# Patient Record
Sex: Female | Born: 1980 | Race: White | Hispanic: No | Marital: Single | Smoking: Former smoker
Health system: Southern US, Community
[De-identification: ages and names within clinical notes are randomized; demographics above are authoritative.]

## PROBLEM LIST (undated history)

## (undated) DIAGNOSIS — F988 Other specified behavioral and emotional disorders with onset usually occurring in childhood and adolescence: Secondary | ICD-10-CM

## (undated) DIAGNOSIS — F419 Anxiety disorder, unspecified: Secondary | ICD-10-CM

## (undated) DIAGNOSIS — G43909 Migraine, unspecified, not intractable, without status migrainosus: Secondary | ICD-10-CM

## (undated) DIAGNOSIS — D649 Anemia, unspecified: Secondary | ICD-10-CM

## (undated) DIAGNOSIS — G4700 Insomnia, unspecified: Secondary | ICD-10-CM

## (undated) DIAGNOSIS — F32A Depression, unspecified: Secondary | ICD-10-CM

## (undated) DIAGNOSIS — Z720 Tobacco use: Secondary | ICD-10-CM

## (undated) DIAGNOSIS — F329 Major depressive disorder, single episode, unspecified: Secondary | ICD-10-CM

## (undated) HISTORY — DX: Insomnia, unspecified: G47.00

## (undated) HISTORY — DX: Other specified behavioral and emotional disorders with onset usually occurring in childhood and adolescence: F98.8

## (undated) HISTORY — PX: WISDOM TOOTH EXTRACTION: SHX21

## (undated) HISTORY — DX: Depression, unspecified: F32.A

## (undated) HISTORY — DX: Migraine, unspecified, not intractable, without status migrainosus: G43.909

## (undated) HISTORY — DX: Anemia, unspecified: D64.9

## (undated) HISTORY — DX: Tobacco use: Z72.0

## (undated) HISTORY — DX: Major depressive disorder, single episode, unspecified: F32.9

## (undated) HISTORY — DX: Anxiety disorder, unspecified: F41.9

---

## 2011-05-15 HISTORY — PX: FRACTURE SURGERY: SHX138

## 2012-08-24 ENCOUNTER — Emergency Department: Payer: Self-pay | Admitting: Emergency Medicine

## 2012-08-25 LAB — CBC
HCT: 36.3 % (ref 35.0–47.0)
MCH: 31.4 pg (ref 26.0–34.0)
MCV: 91 fL (ref 80–100)
Platelet: 253 10*3/uL (ref 150–440)
RBC: 4 10*6/uL (ref 3.80–5.20)
RDW: 12.1 % (ref 11.5–14.5)
WBC: 9.9 10*3/uL (ref 3.6–11.0)

## 2012-08-25 LAB — ETHANOL
Ethanol %: 0.204 % — ABNORMAL HIGH (ref 0.000–0.080)
Ethanol: 204 mg/dL

## 2012-08-25 LAB — BASIC METABOLIC PANEL
Calcium, Total: 7.4 mg/dL — ABNORMAL LOW (ref 8.5–10.1)
Chloride: 119 mmol/L — ABNORMAL HIGH (ref 98–107)
Creatinine: 0.71 mg/dL (ref 0.60–1.30)
EGFR (African American): >60
EGFR (Non-African Amer.): >60
Glucose: 104 mg/dL — ABNORMAL HIGH (ref 65–99)
Potassium: 4.3 mmol/L (ref 3.5–5.1)

## 2013-06-26 ENCOUNTER — Inpatient Hospital Stay: Payer: Self-pay

## 2013-06-27 LAB — DRUG SCREEN, URINE

## 2013-06-28 LAB — HEMATOCRIT: HCT: 18 % — AB (ref 35.0–47.0)

## 2013-06-29 DIAGNOSIS — Z9289 Personal history of other medical treatment: Secondary | ICD-10-CM | POA: Insufficient documentation

## 2013-06-29 LAB — HEMATOCRIT: HCT: 22.2 % — AB (ref 35.0–47.0)

## 2013-08-13 LAB — HM PAP SMEAR: HM PAP: NEGATIVE
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
## 2014-09-21 NOTE — H&P (Signed)
L&D Evaluation:  History:  HPI 34 yo G1 at 9556w4d gestation presenting with contractions.  Seen last 06/23/13 and noted to be 3/80/0.  +FM, no VB.  SROM at 1800 today clear.   Presents with contractions   Patient's Medical History Bipolar, anxiety, anemia   Patient's Surgical History none   Medications ambien 5mg  qhs prn, lamictal 150mg , protonix 40mg , zofran 8mg , adderall 10mg , clonazepam 0.5mg     Allergies NKDA   Social History none   Family History Non-Contributory   ROS:  ROS All systems were reviewed.  HEENT, CNS, GI, GU, Respiratory, CV, Renal and Musculoskeletal systems were found to be normal.   Exam:  Vital Signs stable   General painful contraction   Mental Status clear   Chest no increased work of breathing inbetween contractions   Abdomen gravid, non-tender   Estimated Fetal Weight Average for gestational age   Fetal Position vtx   Back no CVAT   Edema no edema   Pelvic no external lesions, 5/C/0   Mebranes Intact   FHT normal rate with no decels   Plan:  Plan EFM/NST, monitor contractions and for cervical change   Comments 1) Labor - admit for term labor  2) Fetus - Category I tracing     - 40lbs weight gain this pregnancy  3) PNL A pos / ABSC negative / RI / VZI / HIV negative / RPR NR / HBsAg negative / H&H at 28 weeks 10.8 & 31.3 / 1-hr OGTT 99 / pap LSIL HPV positive normal colposcopy this pregnancy will need postpartum pap / GBS negative  4) TDAP 04/29/13  5) Disposition - anticipate SVD   Electronic Signatures: Lorrene ReidStaebler, Joselynne Killam M (MD)  (Signed 13-Feb-15 19:17)  Authored: L&D Evaluation   Last Updated: 13-Feb-15 19:17 by Lorrene ReidStaebler, Marilyne Haseley M (MD)

## 2014-10-18 ENCOUNTER — Telehealth: Payer: Self-pay | Admitting: Family Medicine

## 2014-10-18 NOTE — Telephone Encounter (Signed)
Pt scheduled appointment for June 23 because you had nothing else available. She is requesting enough of adderall to last until appointment day.

## 2014-10-19 MED ORDER — AMPHETAMINE-DEXTROAMPHETAMINE 20 MG PO TABS: 20.0000 mg | ORAL_TABLET | Freq: Two times a day (BID) | ORAL | Status: DC

## 2014-10-20 NOTE — Telephone Encounter (Signed)
I already printed rx

## 2014-10-20 NOTE — Telephone Encounter (Signed)
Monica Bonilla call patient she has appt on June 23 but is asking for refill on adderall to last until she can be see?

## 2014-10-21 ENCOUNTER — Other Ambulatory Visit: Payer: Self-pay | Admitting: Family Medicine

## 2014-10-21 MED ORDER — AMPHETAMINE-DEXTROAMPHETAMINE 20 MG PO TABS: 20.0000 mg | ORAL_TABLET | Freq: Two times a day (BID) | ORAL | Status: DC

## 2014-10-21 NOTE — Telephone Encounter (Signed)
Patient notified prescription ready for pickup.

## 2014-10-22 ENCOUNTER — Other Ambulatory Visit: Payer: Self-pay | Admitting: Family Medicine

## 2014-11-02 ENCOUNTER — Encounter: Payer: Self-pay | Admitting: Family Medicine

## 2014-11-02 DIAGNOSIS — Z915 Personal history of self-harm: Secondary | ICD-10-CM | POA: Insufficient documentation

## 2014-11-02 DIAGNOSIS — G43909 Migraine, unspecified, not intractable, without status migrainosus: Secondary | ICD-10-CM | POA: Insufficient documentation

## 2014-11-02 DIAGNOSIS — Z9151 Personal history of suicidal behavior: Secondary | ICD-10-CM | POA: Insufficient documentation

## 2014-11-02 DIAGNOSIS — F4310 Post-traumatic stress disorder, unspecified: Secondary | ICD-10-CM | POA: Insufficient documentation

## 2014-11-02 DIAGNOSIS — F1911 Other psychoactive substance abuse, in remission: Secondary | ICD-10-CM | POA: Insufficient documentation

## 2014-11-02 DIAGNOSIS — G4700 Insomnia, unspecified: Secondary | ICD-10-CM | POA: Insufficient documentation

## 2014-11-02 DIAGNOSIS — F988 Other specified behavioral and emotional disorders with onset usually occurring in childhood and adolescence: Secondary | ICD-10-CM | POA: Insufficient documentation

## 2014-11-02 DIAGNOSIS — IMO0002 Reserved for concepts with insufficient information to code with codable children: Secondary | ICD-10-CM | POA: Insufficient documentation

## 2014-11-02 DIAGNOSIS — Z862 Personal history of diseases of the blood and blood-forming organs and certain disorders involving the immune mechanism: Secondary | ICD-10-CM | POA: Insufficient documentation

## 2014-11-02 DIAGNOSIS — F339 Major depressive disorder, recurrent, unspecified: Secondary | ICD-10-CM | POA: Insufficient documentation

## 2014-11-02 DIAGNOSIS — J309 Allergic rhinitis, unspecified: Secondary | ICD-10-CM | POA: Insufficient documentation

## 2014-11-04 ENCOUNTER — Ambulatory Visit (INDEPENDENT_AMBULATORY_CARE_PROVIDER_SITE_OTHER): Payer: BLUE CROSS/BLUE SHIELD | Admitting: Family Medicine

## 2014-11-04 ENCOUNTER — Encounter: Payer: Self-pay | Admitting: Family Medicine

## 2014-11-04 VITALS — BP 98/62 | HR 122 | Temp 98.6°F | Resp 14 | Ht 66.0 in | Wt 128.5 lb

## 2014-11-04 DIAGNOSIS — G43009 Migraine without aura, not intractable, without status migrainosus: Secondary | ICD-10-CM | POA: Diagnosis not present

## 2014-11-04 DIAGNOSIS — F909 Attention-deficit hyperactivity disorder, unspecified type: Secondary | ICD-10-CM

## 2014-11-04 DIAGNOSIS — F339 Major depressive disorder, recurrent, unspecified: Secondary | ICD-10-CM

## 2014-11-04 DIAGNOSIS — F988 Other specified behavioral and emotional disorders with onset usually occurring in childhood and adolescence: Secondary | ICD-10-CM

## 2014-11-04 DIAGNOSIS — Z720 Tobacco use: Secondary | ICD-10-CM | POA: Diagnosis not present

## 2014-11-04 DIAGNOSIS — G4700 Insomnia, unspecified: Secondary | ICD-10-CM

## 2014-11-04 MED ORDER — DESVENLAFAXINE SUCCINATE ER 50 MG PO TB24: 50.0000 mg | ORAL_TABLET | Freq: Every day | ORAL | Status: DC

## 2014-11-04 MED ORDER — AMPHETAMINE-DEXTROAMPHETAMINE 30 MG PO TABS: 30.0000 mg | ORAL_TABLET | Freq: Two times a day (BID) | ORAL | Status: DC

## 2014-11-04 NOTE — Patient Instructions (Addendum)
  Contact a suicide hotline, crisis center, or local suicide prevention center for help right away. Local centers may include a hospital, clinic, community service organization, social service provider, or health department.  Call your local emergency services (911 in the [place]).  Call a suicide hotline:  1-800-273-TALK (450-255-0165) in the [place].  1-800-SUICIDE 223-611-5622) in the [place].  412-037-0154 in the [place] for Spanish-speaking counselors.  9-798-921-1HER 814-041-5398) in the [place] for TTY users.  Visit the following websites for information and help:  National Suicide Prevention Lifeline: www.suicidepreventionlifeline.org  Hopeline: www.hopeline.com  [employer] for Suicide Prevention: https://www.ayers.com/  For lesbian, gay, bisexual, transgender, or questioning youth, contact [employer]:  6-314-9-F-WYOVZC 854-504-4346) in the [place].  www.thetrevorproject.org  In [place], treatment resources are listed in each province with listings available under [employer] for [employer] or similar titles. Another source for Crisis Centres by [place] is located at http://www.suicideprevention.ca/in-crisis-now/find-a-crisis-centre-now/crisis-centres Document Released: 11/04/2002 Document Revised: 07/23/2011 Document Reviewed: 01/06/2008 [facility] Patient Information 2015 [place], [place]. This information is not intended to replace advice given to you by your health care provider. Make sure you discuss any questions you have with your health care provider.
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
## 2014-11-19 ENCOUNTER — Telehealth: Payer: Self-pay | Admitting: Family Medicine

## 2014-11-19 ENCOUNTER — Other Ambulatory Visit: Payer: Self-pay | Admitting: Family Medicine

## 2014-11-19 NOTE — Telephone Encounter (Signed)
Best contact # for her for today is at work. [phone]

## 2014-11-19 NOTE — Telephone Encounter (Signed)
Patient called and states she would like to try the Cymbalta.

## 2014-11-19 NOTE — Telephone Encounter (Signed)
She could not tolerate Effexor. Has she tried Cymbalta?  What about Lexapro? I can try those

## 2014-11-19 NOTE — Telephone Encounter (Signed)
Would you like to have the patient try something else due to cost?

## 2014-11-19 NOTE — Telephone Encounter (Signed)
Patient states she needs a call back. She states she cannot afford Prestig. She states even with her insurance and her voucher its still to high for her. She is requesting a call back.

## 2014-11-19 NOTE — Telephone Encounter (Signed)
Tried to call patient work several times, and it goes to [employer]. Patient home number does not have a vm.

## 2014-11-22 ENCOUNTER — Other Ambulatory Visit: Payer: Self-pay | Admitting: Family Medicine

## 2014-11-22 MED ORDER — DULOXETINE HCL 60 MG PO CPEP: 60.0000 mg | ORAL_CAPSULE | Freq: Every day | ORAL | Status: DC

## 2014-11-22 NOTE — Telephone Encounter (Signed)
Sent Cymbalta 

## 2014-11-24 ENCOUNTER — Other Ambulatory Visit: Payer: Self-pay | Admitting: Family Medicine

## 2014-11-30 ENCOUNTER — Ambulatory Visit: Payer: BLUE CROSS/BLUE SHIELD | Admitting: Family Medicine

## 2014-12-01 ENCOUNTER — Encounter: Payer: Self-pay | Admitting: Family Medicine

## 2014-12-01 ENCOUNTER — Ambulatory Visit (INDEPENDENT_AMBULATORY_CARE_PROVIDER_SITE_OTHER): Payer: BLUE CROSS/BLUE SHIELD | Admitting: Family Medicine

## 2014-12-01 ENCOUNTER — Telehealth: Payer: Self-pay | Admitting: Family Medicine

## 2014-12-01 VITALS — BP 84/58 | HR 100 | Temp 97.9°F | Resp 16 | Ht 65.0 in | Wt 132.8 lb

## 2014-12-01 DIAGNOSIS — F988 Other specified behavioral and emotional disorders with onset usually occurring in childhood and adolescence: Secondary | ICD-10-CM

## 2014-12-01 DIAGNOSIS — F339 Major depressive disorder, recurrent, unspecified: Secondary | ICD-10-CM

## 2014-12-01 DIAGNOSIS — G4700 Insomnia, unspecified: Secondary | ICD-10-CM | POA: Diagnosis not present

## 2014-12-01 DIAGNOSIS — R000 Tachycardia, unspecified: Secondary | ICD-10-CM

## 2014-12-01 DIAGNOSIS — F410 Panic disorder [episodic paroxysmal anxiety] without agoraphobia: Secondary | ICD-10-CM

## 2014-12-01 DIAGNOSIS — F909 Attention-deficit hyperactivity disorder, unspecified type: Secondary | ICD-10-CM

## 2014-12-01 MED ORDER — AMPHETAMINE-DEXTROAMPHETAMINE 30 MG PO TABS: 30.0000 mg | ORAL_TABLET | Freq: Every day | ORAL | Status: DC

## 2014-12-01 MED ORDER — CLONAZEPAM 0.5 MG PO TABS: 0.5000 mg | ORAL_TABLET | Freq: Two times a day (BID) | ORAL | Status: DC | PRN

## 2014-12-01 MED ORDER — AMPHETAMINE-DEXTROAMPHETAMINE 30 MG PO TABS: 30.0000 mg | ORAL_TABLET | Freq: Two times a day (BID) | ORAL | Status: DC
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
## 2014-12-01 NOTE — Telephone Encounter (Signed)
Patient is requesting a refill on all her medications. She missed her appointment due to a sick child. States that she works at [employer] and is able to get here in 5min if you are willing to work her in before you leave [phone]

## 2014-12-01 NOTE — Telephone Encounter (Signed)
Please advise 

## 2014-12-17 ENCOUNTER — Other Ambulatory Visit: Payer: Self-pay | Admitting: Family Medicine

## 2014-12-17 NOTE — Telephone Encounter (Signed)
Patient requesting refill. 

## 2014-12-27 ENCOUNTER — Other Ambulatory Visit: Payer: Self-pay | Admitting: Family Medicine

## 2014-12-27 NOTE — Telephone Encounter (Signed)
Patient requesting refill. 

## 2015-02-14 ENCOUNTER — Telehealth: Payer: Self-pay | Admitting: Family Medicine

## 2015-02-14 DIAGNOSIS — F988 Other specified behavioral and emotional disorders with onset usually occurring in childhood and adolescence: Secondary | ICD-10-CM

## 2015-02-14 MED ORDER — AMPHETAMINE-DEXTROAMPHETAMINE 30 MG PO TABS: 30.0000 mg | ORAL_TABLET | Freq: Two times a day (BID) | ORAL | Status: DC

## 2015-02-14 NOTE — Telephone Encounter (Signed)
Adderral was written for once daily however patient normally take it twice daily. Was given 3 months of the prescription. The August one was correct but September and October was incorrect. She has the one for October with her. Would like to know if you could write her a new prescription for October. Please give call so she can completely understand what is going on.

## 2015-02-14 NOTE — Telephone Encounter (Signed)
Is patient suppose to take once daily or bid? If patient is suppose to take bid could you please rewrite October prescription for the patient and I will let her know to pick it up.

## 2015-02-14 NOTE — Telephone Encounter (Signed)
Twice daily, printed

## 2015-02-15 ENCOUNTER — Encounter: Payer: Self-pay | Admitting: Family Medicine

## 2015-02-15 ENCOUNTER — Ambulatory Visit (INDEPENDENT_AMBULATORY_CARE_PROVIDER_SITE_OTHER): Payer: BLUE CROSS/BLUE SHIELD | Admitting: Family Medicine

## 2015-02-15 VITALS — BP 108/66 | HR 96 | Temp 98.5°F | Resp 16 | Ht 65.0 in | Wt 135.3 lb

## 2015-02-15 DIAGNOSIS — F988 Other specified behavioral and emotional disorders with onset usually occurring in childhood and adolescence: Secondary | ICD-10-CM

## 2015-02-15 DIAGNOSIS — F410 Panic disorder [episodic paroxysmal anxiety] without agoraphobia: Secondary | ICD-10-CM

## 2015-02-15 DIAGNOSIS — F339 Major depressive disorder, recurrent, unspecified: Secondary | ICD-10-CM

## 2015-02-15 DIAGNOSIS — F909 Attention-deficit hyperactivity disorder, unspecified type: Secondary | ICD-10-CM

## 2015-02-15 DIAGNOSIS — G4700 Insomnia, unspecified: Secondary | ICD-10-CM

## 2015-02-15 MED ORDER — CLONAZEPAM 0.5 MG PO TABS: 0.5000 mg | ORAL_TABLET | Freq: Two times a day (BID) | ORAL | Status: DC | PRN

## 2015-02-15 MED ORDER — VORTIOXETINE HBR 10 MG PO TABS: 1.0000 | ORAL_TABLET | Freq: Every day | ORAL | Status: DC

## 2015-02-15 MED ORDER — AMPHETAMINE-DEXTROAMPHETAMINE 30 MG PO TABS: 30.0000 mg | ORAL_TABLET | Freq: Two times a day (BID) | ORAL | Status: DC
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
## 2015-02-22 ENCOUNTER — Telehealth: Payer: Self-pay | Admitting: Family Medicine

## 2015-02-22 NOTE — Telephone Encounter (Signed)
Requesting return call to discuss her medication. She is confused as to the directions on how to take it

## 2015-02-24 NOTE — Telephone Encounter (Signed)
Patient informed to take Trintellix daily.

## 2015-02-24 NOTE — Telephone Encounter (Signed)
ERRENOUS °

## 2015-03-04 ENCOUNTER — Ambulatory Visit: Payer: BLUE CROSS/BLUE SHIELD | Admitting: Family Medicine

## 2015-06-13 ENCOUNTER — Encounter: Payer: Self-pay | Admitting: Family Medicine

## 2015-06-13 ENCOUNTER — Ambulatory Visit (INDEPENDENT_AMBULATORY_CARE_PROVIDER_SITE_OTHER): Payer: 59 | Admitting: Family Medicine

## 2015-06-13 VITALS — BP 108/72 | HR 107 | Temp 98.2°F | Resp 18 | Wt 130.2 lb

## 2015-06-13 DIAGNOSIS — F339 Major depressive disorder, recurrent, unspecified: Secondary | ICD-10-CM | POA: Diagnosis not present

## 2015-06-13 DIAGNOSIS — F909 Attention-deficit hyperactivity disorder, unspecified type: Secondary | ICD-10-CM

## 2015-06-13 DIAGNOSIS — G4700 Insomnia, unspecified: Secondary | ICD-10-CM | POA: Diagnosis not present

## 2015-06-13 DIAGNOSIS — F988 Other specified behavioral and emotional disorders with onset usually occurring in childhood and adolescence: Secondary | ICD-10-CM

## 2015-06-13 MED ORDER — ZOLPIDEM TARTRATE 10 MG PO TABS: 10.0000 mg | ORAL_TABLET | Freq: Every evening | ORAL | Status: DC | PRN

## 2015-06-13 MED ORDER — AMPHETAMINE-DEXTROAMPHETAMINE 30 MG PO TABS: 30.0000 mg | ORAL_TABLET | Freq: Two times a day (BID) | ORAL | Status: DC
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
## 2015-06-13 NOTE — Addendum Note (Signed)
Addended by: Alba Cory F on: 06/13/2015 10:13 AM   Modules accepted: Orders

## 2015-06-15 ENCOUNTER — Other Ambulatory Visit: Payer: Self-pay | Admitting: Family Medicine

## 2015-06-16 ENCOUNTER — Telehealth: Payer: Self-pay

## 2015-06-16 NOTE — Telephone Encounter (Signed)
Patient requesting refill. 

## 2015-06-16 NOTE — Telephone Encounter (Signed)
Patient came in to get a her rx but wanted [place] to know as a FYI that when she goes to the pharmacy she only gets a 48-month supply of her meds although it is usually written for 3 months.   She stated that her insurance only covers #15 of the Ambien so she breaks them in half.   I told her that I will put in a note that way if her pharmacists had any questions we will be able to answer them.

## 2015-07-12 DIAGNOSIS — H5213 Myopia, bilateral: Secondary | ICD-10-CM | POA: Diagnosis not present

## 2015-07-12 DIAGNOSIS — H52223 Regular astigmatism, bilateral: Secondary | ICD-10-CM | POA: Diagnosis not present

## 2015-07-15 ENCOUNTER — Telehealth: Payer: Self-pay

## 2015-07-15 MED ORDER — FLUCONAZOLE 150 MG PO TABS: 150.0000 mg | ORAL_TABLET | ORAL | Status: DC

## 2015-07-15 NOTE — Telephone Encounter (Signed)
Patient is coming in Monday to see you and wondered if you would call her in some Diflucan, due to symptoms going on for 7 days and symptoms are getting worst. Patient is having vaginal itching, clear discharge and did not know if she could wait until Monday.

## 2015-07-18 ENCOUNTER — Ambulatory Visit: Payer: BLUE CROSS/BLUE SHIELD | Admitting: Family Medicine

## 2015-07-20 ENCOUNTER — Other Ambulatory Visit: Payer: Self-pay

## 2015-07-20 ENCOUNTER — Telehealth: Payer: Self-pay

## 2015-07-20 DIAGNOSIS — F988 Other specified behavioral and emotional disorders with onset usually occurring in childhood and adolescence: Secondary | ICD-10-CM

## 2015-07-20 NOTE — Telephone Encounter (Signed)
Patient called stating that the pharmacy will not fill her adderall rx the way it was written before. She stated that it needs to be written for each specific month with the future date of when it can be filled.  Refill request was sent to Dr. Alba CoryKrichna Sowles for approval and submission.

## 2015-07-20 NOTE — Telephone Encounter (Signed)
Patient is requesting a return call from Dr Carlynn PurlSowles

## 2015-07-20 NOTE — Telephone Encounter (Signed)
Patient called stating that she is frustrated and really needs her Adderall medication.  Spoke to [place] with [facility] and she stated that this patient's insurance only covers a 388-month supply so although it was written for a 3735-month supply when she picked it up it forfeited the other 2 months, so she needs a new rx for the Adderall in order for her to get it filled this month.

## 2015-07-20 NOTE — Telephone Encounter (Signed)
Please call pharmacy to verify that the other two months have been cancelled. I will write a rx tomorrow since it needs to be a hard copy.

## 2015-07-21 ENCOUNTER — Other Ambulatory Visit: Payer: Self-pay | Admitting: Family Medicine

## 2015-07-21 DIAGNOSIS — F988 Other specified behavioral and emotional disorders with onset usually occurring in childhood and adolescence: Secondary | ICD-10-CM

## 2015-07-21 MED ORDER — AMPHETAMINE-DEXTROAMPHETAMINE 30 MG PO TABS: 30.0000 mg | ORAL_TABLET | Freq: Two times a day (BID) | ORAL | Status: DC

## 2015-07-21 MED ORDER — AMPHETAMINE-DEXTROAMPHET ER 30 MG PO CP24: 30.0000 mg | ORAL_CAPSULE | ORAL | Status: DC

## 2015-07-21 MED ORDER — AMPHETAMINE-DEXTROAMPHETAMINE 30 MG PO TABS: 30.0000 mg | ORAL_TABLET | Freq: Every day | ORAL | Status: DC

## 2015-07-21 NOTE — Telephone Encounter (Signed)
Yes, Delice Bisonara the pharmacist said that the other 2 was not filled due to the way it was written.

## 2015-07-25 ENCOUNTER — Ambulatory Visit: Payer: 59 | Admitting: Family Medicine

## 2015-07-25 ENCOUNTER — Telehealth: Payer: Self-pay | Admitting: Family Medicine

## 2015-07-25 DIAGNOSIS — G4700 Insomnia, unspecified: Secondary | ICD-10-CM

## 2015-07-25 MED ORDER — ZOLPIDEM TARTRATE 10 MG PO TABS: 10.0000 mg | ORAL_TABLET | Freq: Every evening | ORAL | Status: DC | PRN

## 2015-07-25 NOTE — Telephone Encounter (Signed)
Patient had appointment for today but had to resch for next Monday 08-01-15. She is asking for a refill on Ambien

## 2015-08-01 ENCOUNTER — Ambulatory Visit (INDEPENDENT_AMBULATORY_CARE_PROVIDER_SITE_OTHER): Payer: 59 | Admitting: Family Medicine

## 2015-08-01 ENCOUNTER — Encounter: Payer: Self-pay | Admitting: Family Medicine

## 2015-08-01 VITALS — BP 106/66 | HR 95 | Temp 97.8°F | Resp 16 | Ht 65.0 in | Wt 132.4 lb

## 2015-08-01 DIAGNOSIS — G4700 Insomnia, unspecified: Secondary | ICD-10-CM | POA: Diagnosis not present

## 2015-08-01 DIAGNOSIS — F410 Panic disorder [episodic paroxysmal anxiety] without agoraphobia: Secondary | ICD-10-CM

## 2015-08-01 DIAGNOSIS — F988 Other specified behavioral and emotional disorders with onset usually occurring in childhood and adolescence: Secondary | ICD-10-CM

## 2015-08-01 DIAGNOSIS — F909 Attention-deficit hyperactivity disorder, unspecified type: Secondary | ICD-10-CM

## 2015-08-01 DIAGNOSIS — F339 Major depressive disorder, recurrent, unspecified: Secondary | ICD-10-CM | POA: Diagnosis not present

## 2015-08-01 MED ORDER — AMPHETAMINE-DEXTROAMPHETAMINE 30 MG PO TABS: 30.0000 mg | ORAL_TABLET | Freq: Two times a day (BID) | ORAL | Status: DC

## 2015-08-01 MED ORDER — ZOLPIDEM TARTRATE 10 MG PO TABS: 10.0000 mg | ORAL_TABLET | Freq: Every evening | ORAL | Status: DC | PRN
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
## 2015-08-19 ENCOUNTER — Other Ambulatory Visit: Payer: Self-pay | Admitting: Family Medicine

## 2015-08-19 NOTE — Telephone Encounter (Signed)
Patient requesting refill. 

## 2015-08-22 ENCOUNTER — Telehealth: Payer: Self-pay

## 2015-08-22 NOTE — Telephone Encounter (Signed)
The pharmacist called stating that she needed clarity on this patient's adderall rx.  Instruction was given to her as it was written in this patient's chart.

## 2015-09-08 ENCOUNTER — Other Ambulatory Visit: Payer: Self-pay

## 2015-09-08 MED ORDER — CLONAZEPAM 0.5 MG PO TABS: 0.5000 mg | ORAL_TABLET | Freq: Two times a day (BID) | ORAL | Status: DC | PRN

## 2015-09-08 NOTE — Telephone Encounter (Signed)
Patient requesting refill. Patient picked up her prescription from 06/16/15 but was told in her visit if she needed a refill to just have her pharmacy call us. But patient states she is switching pharmacist to [facility] and has a follow up with Dr. Carlynn PurlSowles on November 01, 2015 at 11:40 p.m. Can you please send in a another refill of Clonazepam until her follow up. Thanks

## 2015-10-26 ENCOUNTER — Other Ambulatory Visit: Payer: Self-pay | Admitting: Family Medicine

## 2015-10-26 NOTE — Telephone Encounter (Signed)
Patient requesting refill. 

## 2015-11-01 ENCOUNTER — Ambulatory Visit (INDEPENDENT_AMBULATORY_CARE_PROVIDER_SITE_OTHER): Payer: 59 | Admitting: Family Medicine

## 2015-11-01 ENCOUNTER — Encounter: Payer: Self-pay | Admitting: Family Medicine

## 2015-11-01 VITALS — BP 105/63 | HR 104 | Temp 98.1°F | Resp 18 | Ht 65.0 in | Wt 129.1 lb

## 2015-11-01 DIAGNOSIS — F339 Major depressive disorder, recurrent, unspecified: Secondary | ICD-10-CM | POA: Diagnosis not present

## 2015-11-01 DIAGNOSIS — F988 Other specified behavioral and emotional disorders with onset usually occurring in childhood and adolescence: Secondary | ICD-10-CM

## 2015-11-01 DIAGNOSIS — G43009 Migraine without aura, not intractable, without status migrainosus: Secondary | ICD-10-CM | POA: Diagnosis not present

## 2015-11-01 DIAGNOSIS — F909 Attention-deficit hyperactivity disorder, unspecified type: Secondary | ICD-10-CM | POA: Diagnosis not present

## 2015-11-01 DIAGNOSIS — G4700 Insomnia, unspecified: Secondary | ICD-10-CM | POA: Diagnosis not present

## 2015-11-01 MED ORDER — AMPHETAMINE-DEXTROAMPHETAMINE 30 MG PO TABS: 30.0000 mg | ORAL_TABLET | Freq: Two times a day (BID) | ORAL | Status: DC

## 2015-11-01 MED ORDER — ZOLPIDEM TARTRATE 10 MG PO TABS: 10.0000 mg | ORAL_TABLET | Freq: Every evening | ORAL | Status: DC | PRN

## 2015-11-01 MED ORDER — PREDNISONE 10 MG PO TABS: 10.0000 mg | ORAL_TABLET | Freq: Two times a day (BID) | ORAL | Status: DC

## 2015-11-01 MED ORDER — CLONAZEPAM 0.5 MG PO TABS: 0.5000 mg | ORAL_TABLET | Freq: Two times a day (BID) | ORAL | Status: DC | PRN

## 2015-11-01 MED ORDER — VORTIOXETINE HBR 10 MG PO TABS: ORAL_TABLET | ORAL | Status: DC
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
## 2016-01-23 ENCOUNTER — Other Ambulatory Visit: Payer: Self-pay

## 2016-01-24 ENCOUNTER — Encounter: Payer: Self-pay | Admitting: Family Medicine

## 2016-01-24 ENCOUNTER — Ambulatory Visit (INDEPENDENT_AMBULATORY_CARE_PROVIDER_SITE_OTHER): Payer: 59 | Admitting: Family Medicine

## 2016-01-24 VITALS — BP 108/68 | HR 78 | Temp 97.9°F | Resp 18 | Wt 136.4 lb

## 2016-01-24 DIAGNOSIS — F339 Major depressive disorder, recurrent, unspecified: Secondary | ICD-10-CM | POA: Diagnosis not present

## 2016-01-24 DIAGNOSIS — G4700 Insomnia, unspecified: Secondary | ICD-10-CM | POA: Diagnosis not present

## 2016-01-24 DIAGNOSIS — F909 Attention-deficit hyperactivity disorder, unspecified type: Secondary | ICD-10-CM

## 2016-01-24 DIAGNOSIS — F410 Panic disorder [episodic paroxysmal anxiety] without agoraphobia: Secondary | ICD-10-CM

## 2016-01-24 DIAGNOSIS — G43009 Migraine without aura, not intractable, without status migrainosus: Secondary | ICD-10-CM

## 2016-01-24 DIAGNOSIS — Z23 Encounter for immunization: Secondary | ICD-10-CM

## 2016-01-24 DIAGNOSIS — F988 Other specified behavioral and emotional disorders with onset usually occurring in childhood and adolescence: Secondary | ICD-10-CM

## 2016-01-24 MED ORDER — AMPHETAMINE-DEXTROAMPHETAMINE 30 MG PO TABS: 30.0000 mg | ORAL_TABLET | Freq: Two times a day (BID) | ORAL | 0 refills | Status: DC

## 2016-01-24 MED ORDER — VORTIOXETINE HBR 20 MG PO TABS: 20.0000 mg | ORAL_TABLET | Freq: Every day | ORAL | 1 refills | Status: DC
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
## 2016-02-21 ENCOUNTER — Ambulatory Visit: Payer: 59 | Admitting: Family Medicine

## 2016-04-24 ENCOUNTER — Encounter: Payer: Self-pay | Admitting: Family Medicine

## 2016-04-24 ENCOUNTER — Ambulatory Visit (INDEPENDENT_AMBULATORY_CARE_PROVIDER_SITE_OTHER): Payer: 59 | Admitting: Family Medicine

## 2016-04-24 VITALS — BP 100/58 | HR 107 | Temp 98.6°F | Resp 14 | Wt 141.0 lb

## 2016-04-24 DIAGNOSIS — F902 Attention-deficit hyperactivity disorder, combined type: Secondary | ICD-10-CM

## 2016-04-24 DIAGNOSIS — F410 Panic disorder [episodic paroxysmal anxiety] without agoraphobia: Secondary | ICD-10-CM | POA: Diagnosis not present

## 2016-04-24 DIAGNOSIS — G43009 Migraine without aura, not intractable, without status migrainosus: Secondary | ICD-10-CM

## 2016-04-24 DIAGNOSIS — G4700 Insomnia, unspecified: Secondary | ICD-10-CM

## 2016-04-24 DIAGNOSIS — F339 Major depressive disorder, recurrent, unspecified: Secondary | ICD-10-CM

## 2016-04-24 DIAGNOSIS — H547 Unspecified visual loss: Secondary | ICD-10-CM

## 2016-04-24 MED ORDER — AMPHETAMINE-DEXTROAMPHETAMINE 30 MG PO TABS: 30.0000 mg | ORAL_TABLET | Freq: Two times a day (BID) | ORAL | 0 refills | Status: DC

## 2016-04-24 MED ORDER — ZOLPIDEM TARTRATE 5 MG PO TABS: 5.0000 mg | ORAL_TABLET | Freq: Every evening | ORAL | 2 refills | Status: DC | PRN

## 2016-04-24 MED ORDER — PREDNISONE 10 MG PO TABS: 10.0000 mg | ORAL_TABLET | Freq: Two times a day (BID) | ORAL | 0 refills | Status: DC

## 2016-04-24 MED ORDER — CLONAZEPAM 0.5 MG PO TABS: 0.5000 mg | ORAL_TABLET | Freq: Two times a day (BID) | ORAL | 2 refills | Status: DC | PRN
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
## 2016-07-24 ENCOUNTER — Ambulatory Visit (INDEPENDENT_AMBULATORY_CARE_PROVIDER_SITE_OTHER): Payer: 59 | Admitting: Family Medicine

## 2016-07-24 ENCOUNTER — Encounter: Payer: Self-pay | Admitting: Family Medicine

## 2016-07-24 VITALS — BP 116/68 | HR 122 | Temp 98.5°F | Resp 16 | Ht 65.0 in | Wt 139.2 lb

## 2016-07-24 DIAGNOSIS — Z131 Encounter for screening for diabetes mellitus: Secondary | ICD-10-CM | POA: Diagnosis not present

## 2016-07-24 DIAGNOSIS — F410 Panic disorder [episodic paroxysmal anxiety] without agoraphobia: Secondary | ICD-10-CM | POA: Diagnosis not present

## 2016-07-24 DIAGNOSIS — F339 Major depressive disorder, recurrent, unspecified: Secondary | ICD-10-CM | POA: Diagnosis not present

## 2016-07-24 DIAGNOSIS — Z79899 Other long term (current) drug therapy: Secondary | ICD-10-CM | POA: Diagnosis not present

## 2016-07-24 DIAGNOSIS — Z13220 Encounter for screening for lipoid disorders: Secondary | ICD-10-CM | POA: Diagnosis not present

## 2016-07-24 DIAGNOSIS — G4700 Insomnia, unspecified: Secondary | ICD-10-CM | POA: Diagnosis not present

## 2016-07-24 DIAGNOSIS — H52223 Regular astigmatism, bilateral: Secondary | ICD-10-CM | POA: Diagnosis not present

## 2016-07-24 DIAGNOSIS — F902 Attention-deficit hyperactivity disorder, combined type: Secondary | ICD-10-CM | POA: Diagnosis not present

## 2016-07-24 DIAGNOSIS — Z862 Personal history of diseases of the blood and blood-forming organs and certain disorders involving the immune mechanism: Secondary | ICD-10-CM

## 2016-07-24 DIAGNOSIS — H5213 Myopia, bilateral: Secondary | ICD-10-CM | POA: Diagnosis not present

## 2016-07-24 DIAGNOSIS — G43009 Migraine without aura, not intractable, without status migrainosus: Secondary | ICD-10-CM

## 2016-07-24 MED ORDER — AMPHETAMINE-DEXTROAMPHETAMINE 30 MG PO TABS: 30.0000 mg | ORAL_TABLET | Freq: Two times a day (BID) | ORAL | 0 refills | Status: DC

## 2016-07-24 MED ORDER — SUVOREXANT 10 MG PO TABS: 1.0000 | ORAL_TABLET | Freq: Every day | ORAL | 0 refills | Status: DC
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
## 2016-08-09 ENCOUNTER — Telehealth: Payer: Self-pay | Admitting: Family Medicine

## 2016-08-09 NOTE — Telephone Encounter (Signed)
Pt states she was put on Belsoma and she does not like how it makes her feel. Pt would like a call back to see what other options she has.

## 2016-08-09 NOTE — Telephone Encounter (Signed)
  We can try Trazodone or Seroquel, I know she is scared of serotonin syndrome... We can monitor, very low dose.

## 2016-08-10 NOTE — Telephone Encounter (Signed)
Patient states she would like to try Trazodone for her sleep instead of Seroquel. Patient states the Belsomra made her tingly and fuzzy feeling all over and like a sleepy hangover she could not wake up from. Patient would like to see if she could get the Trazodone sent in today due to having to work all weekend. Please send into [facility] pharmacy since Dr. Carlynn Purl is out of the office all week.

## 2016-08-14 NOTE — Telephone Encounter (Signed)
Pt states that Monica Bonilla had informed her that she was going to send this message to you. Is it possible for you to write this prescription. Pt is trying to get this done asap due to her going out of town.

## 2016-08-15 NOTE — Telephone Encounter (Signed)
lvm for pt to return call °

## 2016-08-15 NOTE — Telephone Encounter (Signed)
For all medication changes, she should consult with her PCP

## 2016-08-16 NOTE — Telephone Encounter (Signed)
Dr. Sherryll Burger will not fill this controlled medication for patient. Please sent when possible to patient's pharmacy.

## 2016-08-20 ENCOUNTER — Other Ambulatory Visit: Payer: Self-pay | Admitting: Family Medicine

## 2016-08-20 MED ORDER — TRAZODONE HCL 50 MG PO TABS: 25.0000 mg | ORAL_TABLET | Freq: Every evening | ORAL | 2 refills | Status: DC | PRN

## 2016-08-20 NOTE — Telephone Encounter (Signed)
I am sorry that I was out of town. How is she doing? Sent Trazodone

## 2016-08-21 NOTE — Telephone Encounter (Signed)
Patient notified of prescription being sent to her pharmacy.

## 2016-08-24 ENCOUNTER — Other Ambulatory Visit: Payer: Self-pay | Admitting: Family Medicine

## 2016-08-24 DIAGNOSIS — F410 Panic disorder [episodic paroxysmal anxiety] without agoraphobia: Secondary | ICD-10-CM

## 2016-08-24 DIAGNOSIS — F339 Major depressive disorder, recurrent, unspecified: Secondary | ICD-10-CM

## 2016-08-24 NOTE — Telephone Encounter (Signed)
Patient requesting refill of Clonazepam to [facility].

## 2016-08-31 ENCOUNTER — Other Ambulatory Visit: Payer: Self-pay | Admitting: Family Medicine

## 2016-08-31 DIAGNOSIS — F410 Panic disorder [episodic paroxysmal anxiety] without agoraphobia: Secondary | ICD-10-CM

## 2016-08-31 DIAGNOSIS — F339 Major depressive disorder, recurrent, unspecified: Secondary | ICD-10-CM

## 2016-09-07 ENCOUNTER — Other Ambulatory Visit: Payer: Self-pay

## 2016-09-07 ENCOUNTER — Telehealth: Payer: Self-pay | Admitting: Family Medicine

## 2016-09-07 ENCOUNTER — Other Ambulatory Visit: Payer: Self-pay | Admitting: Family Medicine

## 2016-09-07 DIAGNOSIS — Z131 Encounter for screening for diabetes mellitus: Secondary | ICD-10-CM | POA: Diagnosis not present

## 2016-09-07 DIAGNOSIS — G4700 Insomnia, unspecified: Secondary | ICD-10-CM

## 2016-09-07 DIAGNOSIS — Z862 Personal history of diseases of the blood and blood-forming organs and certain disorders involving the immune mechanism: Secondary | ICD-10-CM | POA: Diagnosis not present

## 2016-09-07 DIAGNOSIS — Z13220 Encounter for screening for lipoid disorders: Secondary | ICD-10-CM | POA: Diagnosis not present

## 2016-09-07 DIAGNOSIS — Z79899 Other long term (current) drug therapy: Secondary | ICD-10-CM | POA: Diagnosis not present

## 2016-09-07 LAB — CBC WITH DIFFERENTIAL/PLATELET
BASOS PCT: 1 %
Basophils Absolute: 69 cells/uL (ref 0–200)
Eosinophils Absolute: 138 cells/uL (ref 15–500)
Eosinophils Relative: 2 %
HEMATOCRIT: 39.5 % (ref 35.0–45.0)
HEMOGLOBIN: 13 g/dL (ref 11.7–15.5)
LYMPHS ABS: 1587 {cells}/uL (ref 850–3900)
Lymphocytes Relative: 23 %
MCH: 28.6 pg (ref 27.0–33.0)
MCHC: 32.9 g/dL (ref 32.0–36.0)
MCV: 86.8 fL (ref 80.0–100.0)
MONO ABS: 897 {cells}/uL (ref 200–950)
MPV: 9.4 fL (ref 7.5–12.5)
Monocytes Relative: 13 %
Neutro Abs: 4209 cells/uL (ref 1500–7800)
Neutrophils Relative %: 61 %
Platelets: 353 10*3/uL (ref 140–400)
RBC: 4.55 MIL/uL (ref 3.80–5.10)
RDW: 13 % (ref 11.0–15.0)
WBC: 6.9 10*3/uL (ref 3.8–10.8)

## 2016-09-07 LAB — COMPLETE METABOLIC PANEL WITH GFR
ALT: 8 U/L (ref 6–29)
AST: 10 U/L (ref 10–30)
Albumin: 4.2 g/dL (ref 3.6–5.1)
Alkaline Phosphatase: 46 U/L (ref 33–115)
BUN: 11 mg/dL (ref 7–25)
CALCIUM: 9.4 mg/dL (ref 8.6–10.2)
CHLORIDE: 103 mmol/L (ref 98–110)
CO2: 28 mmol/L (ref 20–31)
Creat: 0.84 mg/dL (ref 0.50–1.10)
GFR, Est Non African American: >89 mL/min (ref >=60)
Glucose, Bld: 94 mg/dL (ref 65–99)
POTASSIUM: 4.9 mmol/L (ref 3.5–5.3)
Sodium: 138 mmol/L (ref 135–146)
Total Bilirubin: 0.3 mg/dL (ref 0.2–1.2)
Total Protein: 6.7 g/dL (ref 6.1–8.1)

## 2016-09-07 LAB — LIPID PANEL
Cholesterol: 147 mg/dL (ref <200)
HDL: 66 mg/dL (ref >50)
LDL CALC: 71 mg/dL (ref <100)
TRIGLYCERIDES: 51 mg/dL (ref <150)
Total CHOL/HDL Ratio: 2.2 Ratio (ref <5.0)
VLDL: 10 mg/dL (ref <30)

## 2016-09-07 MED ORDER — ZOLPIDEM TARTRATE 5 MG PO TABS: 5.0000 mg | ORAL_TABLET | Freq: Every evening | ORAL | 0 refills | Status: DC | PRN

## 2016-09-07 NOTE — Telephone Encounter (Signed)
Informed pt that she has tried all that you can give her and that you suggested to go back to [place] and to cut back on her clonazepam pt stated that she only uses It PRN so that should not be an issue

## 2016-09-07 NOTE — Telephone Encounter (Signed)
Pt states she was put on the Trazodone and she does not like how it makes her feel. Pt wishes to go to something different and was told to just call the office.

## 2016-09-08 LAB — HEMOGLOBIN A1C
Hgb A1c MFr Bld: 5.1 % (ref <5.7)
Mean Plasma Glucose: 100 mg/dL

## 2016-10-05 ENCOUNTER — Telehealth: Payer: Self-pay | Admitting: Family Medicine

## 2016-10-05 DIAGNOSIS — F339 Major depressive disorder, recurrent, unspecified: Secondary | ICD-10-CM

## 2016-10-05 DIAGNOSIS — F410 Panic disorder [episodic paroxysmal anxiety] without agoraphobia: Secondary | ICD-10-CM

## 2016-10-05 NOTE — Telephone Encounter (Signed)
Patient was last seen 3/13

## 2016-10-09 NOTE — Telephone Encounter (Signed)
Pt has scheduled an appointment with Irving BurtonEmily for tomorrow 10/10/16. Pt was a little confused as to why she had to come in because on her last visit Dr Carlynn PurlSowles told her to return in 3 month and she already had appointment scheduled with Dr Carlynn PurlSowles for June. I did inform her what Irving Burtonmily said.

## 2016-10-09 NOTE — Telephone Encounter (Signed)
Called patient left message to call back to discuss medications.

## 2016-10-09 NOTE — Telephone Encounter (Signed)
Please have patient schedule an appointment with myself or Dr. Sherie DonLada sometime this week to follow up and go over her medications. Thank you!

## 2016-10-10 ENCOUNTER — Ambulatory Visit (INDEPENDENT_AMBULATORY_CARE_PROVIDER_SITE_OTHER): Payer: 59 | Admitting: Family Medicine

## 2016-10-10 ENCOUNTER — Encounter: Payer: Self-pay | Admitting: Family Medicine

## 2016-10-10 DIAGNOSIS — F410 Panic disorder [episodic paroxysmal anxiety] without agoraphobia: Secondary | ICD-10-CM | POA: Diagnosis not present

## 2016-10-10 DIAGNOSIS — F339 Major depressive disorder, recurrent, unspecified: Secondary | ICD-10-CM | POA: Diagnosis not present

## 2016-10-10 MED ORDER — CLONAZEPAM 0.5 MG PO TABS: 0.5000 mg | ORAL_TABLET | Freq: Two times a day (BID) | ORAL | 0 refills | Status: DC | PRN
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
## 2016-10-24 ENCOUNTER — Ambulatory Visit: Payer: 59 | Admitting: Family Medicine

## 2016-10-25 ENCOUNTER — Ambulatory Visit (INDEPENDENT_AMBULATORY_CARE_PROVIDER_SITE_OTHER): Payer: 59 | Admitting: Family Medicine

## 2016-10-25 ENCOUNTER — Encounter: Payer: Self-pay | Admitting: Family Medicine

## 2016-10-25 DIAGNOSIS — F902 Attention-deficit hyperactivity disorder, combined type: Secondary | ICD-10-CM

## 2016-10-25 DIAGNOSIS — G4700 Insomnia, unspecified: Secondary | ICD-10-CM

## 2016-10-25 DIAGNOSIS — G43009 Migraine without aura, not intractable, without status migrainosus: Secondary | ICD-10-CM

## 2016-10-25 DIAGNOSIS — F339 Major depressive disorder, recurrent, unspecified: Secondary | ICD-10-CM

## 2016-10-25 MED ORDER — AMPHETAMINE-DEXTROAMPHETAMINE 30 MG PO TABS: 30.0000 mg | ORAL_TABLET | Freq: Two times a day (BID) | ORAL | 0 refills | Status: DC

## 2016-10-25 MED ORDER — ZOLPIDEM TARTRATE 5 MG PO TABS: 5.0000 mg | ORAL_TABLET | Freq: Every evening | ORAL | 0 refills | Status: DC | PRN
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
## 2016-11-26 ENCOUNTER — Other Ambulatory Visit: Payer: Self-pay | Admitting: Family Medicine

## 2016-11-26 DIAGNOSIS — G4700 Insomnia, unspecified: Secondary | ICD-10-CM

## 2016-11-26 DIAGNOSIS — F410 Panic disorder [episodic paroxysmal anxiety] without agoraphobia: Secondary | ICD-10-CM

## 2016-11-26 DIAGNOSIS — F339 Major depressive disorder, recurrent, unspecified: Secondary | ICD-10-CM

## 2016-11-27 ENCOUNTER — Other Ambulatory Visit: Payer: Self-pay | Admitting: Family Medicine

## 2016-11-27 DIAGNOSIS — F410 Panic disorder [episodic paroxysmal anxiety] without agoraphobia: Secondary | ICD-10-CM

## 2016-11-27 DIAGNOSIS — G4700 Insomnia, unspecified: Secondary | ICD-10-CM

## 2016-11-27 DIAGNOSIS — F339 Major depressive disorder, recurrent, unspecified: Secondary | ICD-10-CM

## 2016-11-27 NOTE — Telephone Encounter (Signed)
Pt needs refill on Ambien and Clonazepam to be sent to [facility] pharmacy.

## 2016-12-26 ENCOUNTER — Other Ambulatory Visit: Payer: Self-pay | Admitting: Family Medicine

## 2016-12-26 DIAGNOSIS — G4700 Insomnia, unspecified: Secondary | ICD-10-CM

## 2016-12-26 DIAGNOSIS — F410 Panic disorder [episodic paroxysmal anxiety] without agoraphobia: Secondary | ICD-10-CM

## 2016-12-26 DIAGNOSIS — F339 Major depressive disorder, recurrent, unspecified: Secondary | ICD-10-CM

## 2016-12-26 NOTE — Telephone Encounter (Signed)
Patient requesting refill of Clonazepam and Ambien to [facility].

## 2017-01-04 ENCOUNTER — Other Ambulatory Visit: Payer: Self-pay

## 2017-01-04 DIAGNOSIS — F410 Panic disorder [episodic paroxysmal anxiety] without agoraphobia: Secondary | ICD-10-CM

## 2017-01-04 DIAGNOSIS — F339 Major depressive disorder, recurrent, unspecified: Secondary | ICD-10-CM

## 2017-01-04 MED ORDER — CLONAZEPAM 0.5 MG PO TABS: 0.5000 mg | ORAL_TABLET | Freq: Every day | ORAL | 0 refills | Status: DC

## 2017-01-04 NOTE — Telephone Encounter (Signed)
Patient states her last refill was 11/27/16 with 45 pills with 0 refills. But she is separating her Clonazepam and Ambien apart from each other. But is taking Clonazepam at least one pill a day but will take up to 2 pills if anxiety is worst (and that will be at least a week and half to 2 weeks out of the month).

## 2017-01-16 ENCOUNTER — Ambulatory Visit: Payer: Self-pay | Admitting: Obstetrics and Gynecology

## 2017-01-24 ENCOUNTER — Encounter: Payer: Self-pay | Admitting: Family Medicine

## 2017-01-24 ENCOUNTER — Ambulatory Visit (INDEPENDENT_AMBULATORY_CARE_PROVIDER_SITE_OTHER): Payer: 59 | Admitting: Family Medicine

## 2017-01-24 VITALS — BP 124/78 | HR 106 | Temp 97.8°F | Resp 14 | Ht 65.0 in | Wt 143.5 lb

## 2017-01-24 DIAGNOSIS — Z23 Encounter for immunization: Secondary | ICD-10-CM | POA: Diagnosis not present

## 2017-01-24 DIAGNOSIS — F410 Panic disorder [episodic paroxysmal anxiety] without agoraphobia: Secondary | ICD-10-CM | POA: Diagnosis not present

## 2017-01-24 DIAGNOSIS — F339 Major depressive disorder, recurrent, unspecified: Secondary | ICD-10-CM

## 2017-01-24 DIAGNOSIS — G4700 Insomnia, unspecified: Secondary | ICD-10-CM | POA: Diagnosis not present

## 2017-01-24 DIAGNOSIS — Z30011 Encounter for initial prescription of contraceptive pills: Secondary | ICD-10-CM | POA: Diagnosis not present

## 2017-01-24 DIAGNOSIS — G43009 Migraine without aura, not intractable, without status migrainosus: Secondary | ICD-10-CM | POA: Diagnosis not present

## 2017-01-24 DIAGNOSIS — F902 Attention-deficit hyperactivity disorder, combined type: Secondary | ICD-10-CM

## 2017-01-24 MED ORDER — VORTIOXETINE HBR 20 MG PO TABS: 20.0000 mg | ORAL_TABLET | Freq: Every day | ORAL | 1 refills | Status: DC

## 2017-01-24 MED ORDER — AMPHETAMINE-DEXTROAMPHETAMINE 30 MG PO TABS: 30.0000 mg | ORAL_TABLET | Freq: Two times a day (BID) | ORAL | 0 refills | Status: DC

## 2017-01-24 MED ORDER — NORGESTIMATE-ETH ESTRADIOL 0.25-35 MG-MCG PO TABS: 1.0000 | ORAL_TABLET | Freq: Every day | ORAL | 0 refills | Status: DC

## 2017-01-24 MED ORDER — ZOLPIDEM TARTRATE 5 MG PO TABS
5.0000 mg | ORAL_TABLET | Freq: Every evening | ORAL | 0 refills | Status: DC | PRN
Start: 2017-01-24 — End: 2017-04-26
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
## 2017-02-04 ENCOUNTER — Ambulatory Visit (INDEPENDENT_AMBULATORY_CARE_PROVIDER_SITE_OTHER): Payer: Self-pay | Admitting: Obstetrics and Gynecology

## 2017-02-04 DIAGNOSIS — Z5329 Procedure and treatment not carried out because of patient's decision for other reasons: Secondary | ICD-10-CM

## 2017-02-12 ENCOUNTER — Other Ambulatory Visit: Payer: Self-pay | Admitting: Family Medicine

## 2017-02-12 DIAGNOSIS — F410 Panic disorder [episodic paroxysmal anxiety] without agoraphobia: Secondary | ICD-10-CM

## 2017-02-12 DIAGNOSIS — F339 Major depressive disorder, recurrent, unspecified: Secondary | ICD-10-CM

## 2017-02-12 MED ORDER — CLONAZEPAM 0.5 MG PO TABS: 0.5000 mg | ORAL_TABLET | Freq: Every day | ORAL | 0 refills | Status: DC

## 2017-02-12 NOTE — Telephone Encounter (Signed)
Pt needs refill on Clonazepam

## 2017-02-15 NOTE — Progress Notes (Signed)
No show

## 2017-02-21 ENCOUNTER — Encounter: Payer: Self-pay | Admitting: Certified Nurse Midwife

## 2017-02-21 ENCOUNTER — Ambulatory Visit (INDEPENDENT_AMBULATORY_CARE_PROVIDER_SITE_OTHER): Payer: 59 | Admitting: Certified Nurse Midwife

## 2017-02-21 VITALS — BP 100/60 | HR 93 | Ht 65.0 in | Wt 143.0 lb

## 2017-02-21 DIAGNOSIS — G43829 Menstrual migraine, not intractable, without status migrainosus: Secondary | ICD-10-CM

## 2017-02-21 DIAGNOSIS — Z01419 Encounter for gynecological examination (general) (routine) without abnormal findings: Secondary | ICD-10-CM | POA: Diagnosis not present

## 2017-02-21 DIAGNOSIS — Z30011 Encounter for initial prescription of contraceptive pills: Secondary | ICD-10-CM

## 2017-02-21 DIAGNOSIS — Z124 Encounter for screening for malignant neoplasm of cervix: Secondary | ICD-10-CM

## 2017-02-21 NOTE — Progress Notes (Signed)
Gynecology Annual Exam  PCP: Alba Cory, MD  Chief Complaint:  Chief Complaint  Patient presents with  . Gynecologic Exam    History of Present Illness: Monica Bonilla or "Monica Bonilla" is a 36 y.o. G1 P21 White female who presents for a NP annual exam. The patient complains of menstrual migraines, dysmenorrhea, and increased premenstrual anxiety. Her PCP has just placed her on Sprintec to see if that will reduce these symptoms. Her migraines usually involve the right temporal area, blurred vision in her right eye, nausea and vomiting, and feeling like the right eye is twitching. She takes a low dose Prednisone x 2 days premenstrually and some essential oils. She is currently on her first pack of Sprintec. Her menses are regular, they occur every month, and they last 5 days. Her flow is moderate. She does not have intermenstrual bleeding. Her last menstrual period was 02/04/2017. She has  dysmenorrhea. Last pap smear at her postpartum visit: 09/01/2013, results were NIL/neg. Had a LGSIL PAP with positive HRHPV in 2014 during pregnancy. Colpo done. No bx done.   The patient is  sexually active. She currently uses BCPs for contraception. She does not have dyspareunia.  Her past medical history is remarkable for migraines, ADD, anxiety, depression (?bipolar),and  insomnia  The patient does perform occasional self breast exams. Her last mammogram was NA.  There is no family history of breast cancer. .  There is no family history of ovarian cancer.   The patient is a former smoker and is currently e-vaping. Is trying to stop smoking.  She reports drinking alcohol on occasion.  She denies illegal drug use.  The patient reports exercising occasionally.  She get adequate calcium in her diet.   She recently had a lipid panel done 08/2016 which was normal.    Review of Systems: Review of Systems  Constitutional: Negative for chills, fever and weight loss.  HENT: Negative for  congestion, sinus pain and sore throat.   Eyes: Negative for blurred vision and pain.  Respiratory: Negative for hemoptysis, shortness of breath and wheezing.   Cardiovascular: Negative for chest pain, palpitations and leg swelling.  Gastrointestinal: Negative for abdominal pain, blood in stool, diarrhea, heartburn, nausea and vomiting.  Genitourinary: Negative for dysuria, frequency, hematuria and urgency.  Musculoskeletal: Negative for back pain, joint pain and myalgias.  Skin: Negative for itching and rash.  Neurological: Positive for headaches. Negative for dizziness and tingling.  Endo/Heme/Allergies: Negative for environmental allergies and polydipsia. Does not bruise/bleed easily.       Negative for hirsutism   Psychiatric/Behavioral: Positive for depression. The patient is nervous/anxious and has insomnia.     Past Medical History:  Past Medical History:  Diagnosis Date  . ADD (attention deficit disorder)   . Anemia   . Anxiety   . Depression    ?bipolar  . Insomnia   . Migraine   . Tobacco use     Past Surgical History:  Past Surgical History:  Procedure Laterality Date  . FRACTURE SURGERY  2013   collar bone    Family History:  Family History  Problem Relation Age of Onset  . Hypertension Mother   . Hyperthyroidism Father        died from MVA  . Diabetes Father   . Hypertension Maternal Grandfather   . Diabetes Maternal Grandfather   . Heart attack Paternal Grandfather     Social History:  Social History   Social History  . Marital status: Single  Spouse name: N/A  . Number of children: 1  . Years of education: N/A   Occupational History  . Registered Nurse    Social History Main Topics  . Smoking status: Former Smoker    Years: 1.00    Types: E-cigarettes, Cigarettes    Start date: 11/11/2013  . Smokeless tobacco: Never Used     Comment: Former cig smoker, now using e-cigs  . Alcohol use 0.0 oz/week     Comment: rarely  . Drug use: No  .  Sexual activity: Yes    Partners: Male    Birth control/ protection: Pill   Other Topics Concern  . Not on file   Social History Narrative   Dating Onalee Hua since 2017   Raising her son    Allergies:  Allergies  Allergen Reactions  . Venlafaxine     agitation, tachycardia    Medications:  Current Outpatient Prescriptions:  .  amphetamine-dextroamphetamine (ADDERALL) 30 MG tablet, Take 1 tablet by mouth 2 (two) times daily. Fill on 02/23/2017, Disp: 60 tablet, Rfl: 0 .  clonazePAM (KLONOPIN) 0.5 MG tablet, Take 1-2 tablets (0.5-1 mg total) by mouth daily., Disp: 40 tablet, Rfl: 0 .  norgestimate-ethinyl estradiol (SPRINTEC 28) 0.25-35 MG-MCG tablet, Take 1 tablet by mouth daily., Disp: 1 Package, Rfl: 0 .  vortioxetine HBr (TRINTELLIX) 20 MG TABS, Take 20 mg by mouth daily., Disp: 90 tablet, Rfl: 1 .  zolpidem (AMBIEN) 5 MG tablet, Take 1 tablet (5 mg total) by mouth at bedtime as needed for sleep., Disp: 90 tablet, Rfl: 0  On Guard Immune support vitamins Physical Exam Vitals: BP 100/60   Pulse 93   Ht  (1.651 m)   Wt 143 lb (64.9 kg)   LMP 02/04/2017 (Exact Date)   BMI 23.80 kg/m   General: WF in NAD HEENT: normocephalic, anicteric Neck: no thyroid enlargement, no palpable nodules, no cervical lymphadenopathy  Pulmonary: No increased work of breathing, CTAB Cardiovascular: RRR, without murmur  Breast: Breast symmetrical, no tenderness, no palpable nodules or masses, no skin or nipple retraction present, no nipple discharge.  No axillary, infraclavicular or supraclavicular lymphadenopathy. Abdomen: Soft, non-tender, non-distended.  Umbilicus without lesions.  No hepatomegaly or masses palpable. No evidence of hernia. Genitourinary:  External: Normal external female genitalia.  Normal urethral meatus, normal Bartholin's and Skene's glands.    Vagina: Normal vaginal mucosa, no evidence of prolapse.    Cervix: Grossly normal in appearance, no bleeding,  non-tender  Uterus:  normal size, shape, and consistency, mobile, and non-tender  Adnexa: No adnexal masses, non-tender  Rectal: deferred  Lymphatic: no evidence of inguinal lymphadenopathy Extremities: no edema, erythema, or tenderness Neurologic: Grossly intact Psychiatric: mood appropriate, affect full     Assessment: 36 y.o. NP annual gyn exam Menstrual headaches and dysmenorrhea-trial of Sprintec to see if symptoms abate. Her migraines are atypical, but no scotomata or aura.  Using e-cigarettes  Plan:  1) Breast cancer screening - recommend monthly self breast exam.   2) Cervical cancer screening - Pap was done. ASCCP guidelines and rational discussed.  Patient opts for yearly screening interval  3) Contraception - Will send in refills for Sprintec. Discussed alternatives like LoLoestrin or taking pills continuously to eliminate some menses, or Depo Provera  4) Routine healthcare maintenance including cholesterol and diabetes screening managed by PCP   5) FU if not doing well on Sprintec and in 1 year for annual  Farrel Conners, [place]

## 2017-02-23 ENCOUNTER — Encounter: Payer: Self-pay | Admitting: Certified Nurse Midwife

## 2017-02-23 DIAGNOSIS — G43909 Migraine, unspecified, not intractable, without status migrainosus: Secondary | ICD-10-CM | POA: Insufficient documentation

## 2017-02-23 MED ORDER — NORGESTIMATE-ETH ESTRADIOL 0.25-35 MG-MCG PO TABS: 1.0000 | ORAL_TABLET | Freq: Every day | ORAL | 11 refills | Status: DC

## 2017-02-27 LAB — IGP, APTIMA HPV: PAP SMEAR COMMENT: 0

## 2017-04-01 ENCOUNTER — Other Ambulatory Visit: Payer: Self-pay | Admitting: Family Medicine

## 2017-04-01 DIAGNOSIS — F410 Panic disorder [episodic paroxysmal anxiety] without agoraphobia: Secondary | ICD-10-CM

## 2017-04-01 DIAGNOSIS — F339 Major depressive disorder, recurrent, unspecified: Secondary | ICD-10-CM

## 2017-04-01 NOTE — Telephone Encounter (Signed)
Refill request for general medication. [facility]  Last office visit: 01/24/2017  Next visit: 04/25/2017

## 2017-04-25 ENCOUNTER — Ambulatory Visit: Payer: 59 | Admitting: Family Medicine

## 2017-04-26 ENCOUNTER — Ambulatory Visit (INDEPENDENT_AMBULATORY_CARE_PROVIDER_SITE_OTHER): Payer: 59 | Admitting: Family Medicine

## 2017-04-26 ENCOUNTER — Encounter: Payer: Self-pay | Admitting: Family Medicine

## 2017-04-26 VITALS — BP 98/60 | HR 114 | Resp 14 | Ht 65.0 in | Wt 140.3 lb

## 2017-04-26 DIAGNOSIS — F902 Attention-deficit hyperactivity disorder, combined type: Secondary | ICD-10-CM | POA: Diagnosis not present

## 2017-04-26 DIAGNOSIS — G4700 Insomnia, unspecified: Secondary | ICD-10-CM

## 2017-04-26 DIAGNOSIS — R031 Nonspecific low blood-pressure reading: Secondary | ICD-10-CM

## 2017-04-26 DIAGNOSIS — F339 Major depressive disorder, recurrent, unspecified: Secondary | ICD-10-CM | POA: Diagnosis not present

## 2017-04-26 DIAGNOSIS — F411 Generalized anxiety disorder: Secondary | ICD-10-CM | POA: Diagnosis not present

## 2017-04-26 MED ORDER — HYDROXYZINE HCL 10 MG PO TABS: 10.0000 mg | ORAL_TABLET | Freq: Two times a day (BID) | ORAL | 0 refills | Status: DC

## 2017-04-26 MED ORDER — ZOLPIDEM TARTRATE 5 MG PO TABS: 5.0000 mg | ORAL_TABLET | Freq: Every evening | ORAL | 0 refills | Status: DC | PRN

## 2017-04-26 MED ORDER — AMPHETAMINE-DEXTROAMPHETAMINE 30 MG PO TABS: 30.0000 mg | ORAL_TABLET | Freq: Every day | ORAL | 0 refills | Status: DC

## 2017-04-26 MED ORDER — AMPHETAMINE-DEXTROAMPHETAMINE 30 MG PO TABS: 30.0000 mg | ORAL_TABLET | Freq: Two times a day (BID) | ORAL | 0 refills | Status: DC
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
## 2017-04-29 ENCOUNTER — Encounter: Payer: Self-pay | Admitting: Family Medicine

## 2017-04-29 ENCOUNTER — Ambulatory Visit (INDEPENDENT_AMBULATORY_CARE_PROVIDER_SITE_OTHER): Payer: 59 | Admitting: Family Medicine

## 2017-04-29 ENCOUNTER — Ambulatory Visit: Payer: Self-pay | Admitting: *Deleted

## 2017-04-29 VITALS — BP 130/80 | HR 112 | Temp 99.2°F | Resp 14 | Wt 142.9 lb

## 2017-04-29 DIAGNOSIS — J029 Acute pharyngitis, unspecified: Secondary | ICD-10-CM | POA: Diagnosis not present

## 2017-04-29 DIAGNOSIS — R0602 Shortness of breath: Secondary | ICD-10-CM

## 2017-04-29 DIAGNOSIS — J069 Acute upper respiratory infection, unspecified: Secondary | ICD-10-CM

## 2017-04-29 DIAGNOSIS — J040 Acute laryngitis: Secondary | ICD-10-CM

## 2017-04-29 LAB — POCT RAPID STREP A (OFFICE): Rapid Strep A Screen: NEGATIVE

## 2017-04-29 MED ORDER — FLUTICASONE FUROATE-VILANTEROL 100-25 MCG/INH IN AEPB: 1.0000 | INHALATION_SPRAY | Freq: Every day | RESPIRATORY_TRACT | 0 refills | Status: DC

## 2017-04-29 MED ORDER — PREDNISONE 20 MG PO TABS: 20.0000 mg | ORAL_TABLET | Freq: Every day | ORAL | 0 refills | Status: DC

## 2017-04-29 NOTE — Telephone Encounter (Signed)
This encounter was created in error - please disregard.

## 2017-04-29 NOTE — Telephone Encounter (Signed)
Pt states she feels like she is having some shortness of breathe, along with a sore throat. Throat is red and very sore. No fever at this time. Is a work now. Appointment made for at 1500 today.  Reason for Disposition . SEVERE (e.g., excruciating) throat pain  Answer Assessment - Initial Assessment Questions 1. ONSET: "When did the throat start hurting?" (Hours or days ago)      Saturday 2. SEVERITY: "How bad is the sore throat?" (Scale 1-10; mild, moderate or severe)   - MILD (1-3):  doesn't interfere with eating or normal activities   - MODERATE (4-7): interferes with eating some solids and normal activities   - SEVERE (8-10):  excruciating pain, interferes with most normal activities   - SEVERE DYSPHAGIA: can't swallow liquids, drooling     moderate 3. STREP EXPOSURE: "Has there been any exposure to strep within the past week?" If so, ask: "What type of contact occurred?"      Yes, works as a Engineer, civil (consulting)nurse 4.  VIRAL SYMPTOMS: "Are there any symptoms of a cold, such as a runny nose, cough, hoarse voice or red eyes?"      Hoarse voice, runny, dry cough 5. FEVER: "Do you have a fever?" If so, ask: "What is your temperature, how was it measured, and when did it start?"     no 6. PUS ON THE TONSILS: "Is there pus on the tonsils in the back of your throat?"     Left side is very red, no white spots 7. OTHER SYMPTOMS: "Do you have any other symptoms?" (e.g., difficulty breathing, headache, rash)     Shortness of breathing (main concern), some pressure between the eyes 8. PREGNANCY: "Is there any chance you are pregnant?" "When was your last menstrual period?"     No, LMP spotting with birth control  Protocols used: SORE THROAT-A-AH
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
## 2017-05-17 ENCOUNTER — Other Ambulatory Visit: Payer: Self-pay | Admitting: Family Medicine

## 2017-05-17 DIAGNOSIS — F410 Panic disorder [episodic paroxysmal anxiety] without agoraphobia: Secondary | ICD-10-CM

## 2017-05-17 DIAGNOSIS — F339 Major depressive disorder, recurrent, unspecified: Secondary | ICD-10-CM

## 2017-05-17 MED ORDER — CLONAZEPAM 0.5 MG PO TABS: 0.5000 mg | ORAL_TABLET | Freq: Every day | ORAL | 0 refills | Status: DC

## 2017-05-30 ENCOUNTER — Other Ambulatory Visit: Payer: Self-pay | Admitting: Family Medicine

## 2017-05-30 DIAGNOSIS — F411 Generalized anxiety disorder: Secondary | ICD-10-CM

## 2017-05-30 NOTE — Telephone Encounter (Signed)
Refill request for general medication: Hydroxyzine 10 mg  Last office visit: 04/29/2017  Last physical exam: None Indicated  Follow-up on file. 07/23/2017

## 2017-06-11 ENCOUNTER — Telehealth: Payer: Self-pay

## 2017-06-11 ENCOUNTER — Other Ambulatory Visit: Payer: Self-pay | Admitting: Family Medicine

## 2017-06-11 DIAGNOSIS — F339 Major depressive disorder, recurrent, unspecified: Secondary | ICD-10-CM

## 2017-06-11 DIAGNOSIS — F902 Attention-deficit hyperactivity disorder, combined type: Secondary | ICD-10-CM

## 2017-06-11 MED ORDER — AMPHETAMINE-DEXTROAMPHETAMINE 30 MG PO TABS: 30.0000 mg | ORAL_TABLET | Freq: Every day | ORAL | 0 refills | Status: DC

## 2017-06-11 NOTE — Telephone Encounter (Signed)
Copied from CRM [id number]. Topic: General - Other >> Jun 11, 2017  1:49 PM Gerrianne ScalePayne, Angela L wrote: Reason for CRM: patient calling stating that her  RX for  amphetamine-dextroamphetamine (ADDERALL) 30 MG tablet always said to take twice a day and now it says once a day she also states that the other two RX that she had does say take twice a day the patient also states that the dosage change was never discussed please give pt a call back because she need to fill medicine by tomorrow

## 2017-06-11 NOTE — Telephone Encounter (Signed)
I am so sorry , she needs to verify prescriptions dates and amount every visit, not sure why it gets switched in the system.

## 2017-06-11 NOTE — Telephone Encounter (Signed)
Patient states she already filled the medication on May 30, 2017 and noticed then she would be short. Could you give her a partial prescription?

## 2017-06-12 ENCOUNTER — Other Ambulatory Visit: Payer: Self-pay | Admitting: Family Medicine

## 2017-06-12 DIAGNOSIS — F902 Attention-deficit hyperactivity disorder, combined type: Secondary | ICD-10-CM

## 2017-06-12 DIAGNOSIS — F339 Major depressive disorder, recurrent, unspecified: Secondary | ICD-10-CM

## 2017-06-12 MED ORDER — AMPHETAMINE-DEXTROAMPHETAMINE 30 MG PO TABS: 30.0000 mg | ORAL_TABLET | Freq: Two times a day (BID) | ORAL | 0 refills | Status: DC

## 2017-07-17 ENCOUNTER — Telehealth: Payer: Self-pay | Admitting: Family Medicine

## 2017-07-17 NOTE — Telephone Encounter (Signed)
Copied from CRM [id number]. Topic: Quick Communication - Rx Refill/Question >> Jul 17, 2017  1:50 PM [name], [name] wrote: Medication: amphetamine-dextroamphetamine (ADDERALL) 30 MG tablet 2 times daily, lost written script   Has the patient contacted their pharmacy? Yes.    Preferred Pharmacy (with phone number or street name): [facility]   Agent: Please be advised that RX refills may take up to 3 business days. We ask that you follow-up with your pharmacy.  Patient would also like copy of immunizations, will pick up when ready. Please advise

## 2017-07-22 NOTE — Telephone Encounter (Signed)
Patient had called last week regarding her Rx. I spoke with Dr. Carlynn PurlSowles and she was not willing to refill Rx because it was a controlled substance. I called patient back today to inform her of this and I was unable to reach her, but I left a vm on her phone stating that PCP was not going to give her new Adderall prescriptions until she was due.

## 2017-07-23 ENCOUNTER — Ambulatory Visit: Payer: 59 | Admitting: Family Medicine

## 2017-07-26 ENCOUNTER — Other Ambulatory Visit: Payer: Self-pay | Admitting: Family Medicine

## 2017-07-26 DIAGNOSIS — F902 Attention-deficit hyperactivity disorder, combined type: Secondary | ICD-10-CM

## 2017-07-26 DIAGNOSIS — G4700 Insomnia, unspecified: Secondary | ICD-10-CM

## 2017-07-26 DIAGNOSIS — F410 Panic disorder [episodic paroxysmal anxiety] without agoraphobia: Secondary | ICD-10-CM

## 2017-07-26 DIAGNOSIS — F339 Major depressive disorder, recurrent, unspecified: Secondary | ICD-10-CM

## 2017-07-26 MED ORDER — CLONAZEPAM 0.5 MG PO TABS: 0.5000 mg | ORAL_TABLET | Freq: Every day | ORAL | 0 refills | Status: DC

## 2017-07-26 MED ORDER — AMPHETAMINE-DEXTROAMPHETAMINE 30 MG PO TABS: 30.0000 mg | ORAL_TABLET | Freq: Two times a day (BID) | ORAL | 0 refills | Status: DC

## 2017-07-26 MED ORDER — ZOLPIDEM TARTRATE 5 MG PO TABS: 5.0000 mg | ORAL_TABLET | Freq: Every evening | ORAL | 0 refills | Status: DC | PRN

## 2017-07-29 ENCOUNTER — Telehealth: Payer: Self-pay | Admitting: Family Medicine

## 2017-07-29 NOTE — Telephone Encounter (Signed)
Once a day prn only

## 2017-07-29 NOTE — Telephone Encounter (Signed)
Copied from CRM [id number]. Topic: Quick Communication - See Telephone Encounter >> Jul 29, 2017  4:28 PM Terisa Starraylor, Brittany L wrote: CRM for notification. See Telephone encounter for:   07/29/17.  [facility] need clarification on clonazePAM (KLONOPIN) 0.5 MG tablet, it says take 1-2 tablets my mouth daily and take one everyday as needed. Which one do they need to use?  Fax number is [phone]

## 2017-07-30 NOTE — Telephone Encounter (Signed)
Left voicemail on [facility] prescriber messaging system with correct instructions.

## 2017-08-19 ENCOUNTER — Encounter: Payer: Self-pay | Admitting: Family Medicine

## 2017-08-19 ENCOUNTER — Ambulatory Visit: Payer: 59 | Admitting: Family Medicine

## 2017-08-19 VITALS — BP 100/70 | HR 92 | Resp 16 | Ht 65.0 in | Wt 148.3 lb

## 2017-08-19 DIAGNOSIS — J069 Acute upper respiratory infection, unspecified: Secondary | ICD-10-CM

## 2017-08-19 DIAGNOSIS — F339 Major depressive disorder, recurrent, unspecified: Secondary | ICD-10-CM

## 2017-08-19 DIAGNOSIS — R0602 Shortness of breath: Secondary | ICD-10-CM

## 2017-08-19 DIAGNOSIS — J029 Acute pharyngitis, unspecified: Secondary | ICD-10-CM

## 2017-08-19 DIAGNOSIS — F410 Panic disorder [episodic paroxysmal anxiety] without agoraphobia: Secondary | ICD-10-CM

## 2017-08-19 DIAGNOSIS — F902 Attention-deficit hyperactivity disorder, combined type: Secondary | ICD-10-CM

## 2017-08-19 DIAGNOSIS — F411 Generalized anxiety disorder: Secondary | ICD-10-CM

## 2017-08-19 DIAGNOSIS — G4700 Insomnia, unspecified: Secondary | ICD-10-CM

## 2017-08-19 DIAGNOSIS — G43009 Migraine without aura, not intractable, without status migrainosus: Secondary | ICD-10-CM

## 2017-08-19 DIAGNOSIS — J040 Acute laryngitis: Secondary | ICD-10-CM

## 2017-08-19 MED ORDER — HYDROXYZINE HCL 10 MG PO TABS: 10.0000 mg | ORAL_TABLET | Freq: Three times a day (TID) | ORAL | 2 refills | Status: DC | PRN

## 2017-08-19 MED ORDER — AMPHETAMINE-DEXTROAMPHETAMINE 30 MG PO TABS: 30.0000 mg | ORAL_TABLET | Freq: Two times a day (BID) | ORAL | 0 refills | Status: DC

## 2017-08-19 MED ORDER — VORTIOXETINE HBR 20 MG PO TABS: 20.0000 mg | ORAL_TABLET | Freq: Every day | ORAL | 1 refills | Status: DC

## 2017-08-19 MED ORDER — ZOLPIDEM TARTRATE 5 MG PO TABS: 5.0000 mg | ORAL_TABLET | Freq: Every evening | ORAL | 0 refills | Status: DC | PRN

## 2017-08-19 MED ORDER — PREDNISONE 20 MG PO TABS: 20.0000 mg | ORAL_TABLET | Freq: Every day | ORAL | 0 refills | Status: DC
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
## 2017-09-12 ENCOUNTER — Other Ambulatory Visit: Payer: Self-pay | Admitting: Family Medicine

## 2017-09-12 DIAGNOSIS — F410 Panic disorder [episodic paroxysmal anxiety] without agoraphobia: Secondary | ICD-10-CM

## 2017-09-12 DIAGNOSIS — F339 Major depressive disorder, recurrent, unspecified: Secondary | ICD-10-CM

## 2017-10-03 ENCOUNTER — Other Ambulatory Visit: Payer: Self-pay | Admitting: Family Medicine

## 2017-10-03 DIAGNOSIS — F339 Major depressive disorder, recurrent, unspecified: Secondary | ICD-10-CM

## 2017-10-03 DIAGNOSIS — F902 Attention-deficit hyperactivity disorder, combined type: Secondary | ICD-10-CM

## 2017-10-03 MED ORDER — AMPHETAMINE-DEXTROAMPHETAMINE 30 MG PO TABS: 30.0000 mg | ORAL_TABLET | Freq: Two times a day (BID) | ORAL | 0 refills | Status: DC

## 2017-10-03 NOTE — Telephone Encounter (Signed)
Patient needs refill on Adderall 30 mg, she has an appointment scheduled for 11/19/2017.

## 2017-10-03 NOTE — Telephone Encounter (Signed)
sent 

## 2017-10-03 NOTE — Telephone Encounter (Signed)
Patient needs to schedule an appointment for refills

## 2017-10-03 NOTE — Telephone Encounter (Signed)
Pt has an appt on 11/19/17 for 3 mth fu. Please advise

## 2017-10-03 NOTE — Telephone Encounter (Signed)
Copied from CRM #[id number]. Topic: Quick Communication - Rx [phone] >> Oct 03, 2017 10:42 AM Cipriano Bunker wrote: Medication:  amphetamine-dextroamphetamine (ADDERALL) 30 MG tablet  Has the patient contacted their pharmacy? No. (Agent: If no, request that the patient contact the pharmacy for the refill.) (Agent: If yes, when and what did the pharmacy advise?)  Preferred Pharmacy (with phone number or street name): [id number] - [place], [place] - [address] AT [facility] OF [address] [place] [address] Phone: [phone] Fax: [phone]  Agent: Please be advised that RX refills may take up to 3 business days. We ask that you follow-up with your pharmacy.

## 2017-10-24 ENCOUNTER — Other Ambulatory Visit: Payer: Self-pay | Admitting: Family Medicine

## 2017-10-24 DIAGNOSIS — F410 Panic disorder [episodic paroxysmal anxiety] without agoraphobia: Secondary | ICD-10-CM

## 2017-10-24 DIAGNOSIS — F339 Major depressive disorder, recurrent, unspecified: Secondary | ICD-10-CM

## 2017-11-04 ENCOUNTER — Other Ambulatory Visit: Payer: Self-pay | Admitting: Family Medicine

## 2017-11-04 DIAGNOSIS — F339 Major depressive disorder, recurrent, unspecified: Secondary | ICD-10-CM

## 2017-11-04 DIAGNOSIS — F902 Attention-deficit hyperactivity disorder, combined type: Secondary | ICD-10-CM

## 2017-11-04 NOTE — Telephone Encounter (Signed)
Copied from CRM [id number]. Topic: Quick Communication - See Telephone Encounter >> Nov 04, 2017  2:48 PM Windy KalataMichael, Ladina Shutters L, NT wrote: CRM for notification. See Telephone encounter for: 11/04/17.  Patient is needing a refill on amphetamine-dextroamphetamine (ADDERALL) 30 MG tablet. Please advise.   [id number] [place] - [address] AT [facility] OF [address] [place] [address] Phone: [phone] Fax: [phone]

## 2017-11-05 MED ORDER — AMPHETAMINE-DEXTROAMPHETAMINE 30 MG PO TABS: 30.0000 mg | ORAL_TABLET | Freq: Two times a day (BID) | ORAL | 0 refills | Status: DC

## 2017-11-05 NOTE — Telephone Encounter (Signed)
Refill request for general medication: Adderall 30 mg  Last office visit: 08/19/2017  Last physical exam: None indicated  Follow-ups on file. 11/19/2017

## 2017-11-05 NOTE — Telephone Encounter (Signed)
Patient called back to state she is completely out of the medication below. Advise patient of the policy. She verbalized understanding.

## 2017-11-14 ENCOUNTER — Other Ambulatory Visit: Payer: Self-pay | Admitting: Certified Nurse Midwife

## 2017-11-14 DIAGNOSIS — Z30011 Encounter for initial prescription of contraceptive pills: Secondary | ICD-10-CM

## 2017-11-19 ENCOUNTER — Ambulatory Visit: Payer: Self-pay | Admitting: Family Medicine

## 2017-11-25 ENCOUNTER — Encounter: Payer: Self-pay | Admitting: Family Medicine

## 2017-11-25 ENCOUNTER — Ambulatory Visit (INDEPENDENT_AMBULATORY_CARE_PROVIDER_SITE_OTHER): Payer: Self-pay | Admitting: Family Medicine

## 2017-11-25 VITALS — BP 104/62 | HR 94 | Temp 98.0°F | Resp 16 | Ht 65.0 in | Wt 146.4 lb

## 2017-11-25 DIAGNOSIS — F411 Generalized anxiety disorder: Secondary | ICD-10-CM | POA: Insufficient documentation

## 2017-11-25 DIAGNOSIS — F410 Panic disorder [episodic paroxysmal anxiety] without agoraphobia: Secondary | ICD-10-CM

## 2017-11-25 DIAGNOSIS — F902 Attention-deficit hyperactivity disorder, combined type: Secondary | ICD-10-CM

## 2017-11-25 DIAGNOSIS — G43009 Migraine without aura, not intractable, without status migrainosus: Secondary | ICD-10-CM

## 2017-11-25 DIAGNOSIS — F339 Major depressive disorder, recurrent, unspecified: Secondary | ICD-10-CM

## 2017-11-25 DIAGNOSIS — G4700 Insomnia, unspecified: Secondary | ICD-10-CM

## 2017-11-25 MED ORDER — AMPHETAMINE-DEXTROAMPHETAMINE 30 MG PO TABS: 30.0000 mg | ORAL_TABLET | Freq: Every day | ORAL | 0 refills | Status: DC

## 2017-11-25 MED ORDER — AMPHETAMINE-DEXTROAMPHETAMINE 30 MG PO TABS: 30.0000 mg | ORAL_TABLET | Freq: Two times a day (BID) | ORAL | 0 refills | Status: DC

## 2017-11-25 MED ORDER — ZOLPIDEM TARTRATE 5 MG PO TABS: 5.0000 mg | ORAL_TABLET | Freq: Every evening | ORAL | 2 refills | Status: DC | PRN

## 2017-11-25 MED ORDER — AMPHETAMINE-DEXTROAMPHETAMINE 30 MG PO TABS
30.0000 mg | ORAL_TABLET | Freq: Every day | ORAL | 0 refills | Status: DC
Start: 2017-11-25 — End: 2017-11-25
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
## 2017-11-25 NOTE — Addendum Note (Signed)
Addended by: Cynda FamiliaJOHNSON, Ruthie Berch L on: 11/25/2017 03:10 PM   Modules accepted: Orders

## 2017-11-28 ENCOUNTER — Other Ambulatory Visit: Payer: Self-pay | Admitting: Certified Nurse Midwife

## 2017-11-28 DIAGNOSIS — Z30011 Encounter for initial prescription of contraceptive pills: Secondary | ICD-10-CM

## 2017-11-30 ENCOUNTER — Other Ambulatory Visit: Payer: Self-pay | Admitting: Certified Nurse Midwife

## 2017-11-30 MED ORDER — NORGESTIMATE-ETH ESTRADIOL 0.25-35 MG-MCG PO TABS: 1.0000 | ORAL_TABLET | Freq: Every day | ORAL | 0 refills | Status: DC

## 2017-11-30 NOTE — Progress Notes (Signed)
Patient called regarding her RX for OCPs. Not doing well on latest generic. Wants to resume Sprintec. RX written for Sprintec #3packs/ NR. Farrel Connersolleen Neya Creegan, CNM

## 2017-12-18 ENCOUNTER — Other Ambulatory Visit: Payer: Self-pay | Admitting: Family Medicine

## 2017-12-18 DIAGNOSIS — F339 Major depressive disorder, recurrent, unspecified: Secondary | ICD-10-CM

## 2017-12-18 DIAGNOSIS — F410 Panic disorder [episodic paroxysmal anxiety] without agoraphobia: Secondary | ICD-10-CM

## 2017-12-18 NOTE — Telephone Encounter (Signed)
Refill request for general medication. [facility].   Last office visit 11/25/17   Follow on 02/25/18

## 2018-01-10 ENCOUNTER — Other Ambulatory Visit: Payer: Self-pay | Admitting: Family Medicine

## 2018-01-10 DIAGNOSIS — G4700 Insomnia, unspecified: Secondary | ICD-10-CM

## 2018-01-10 NOTE — Telephone Encounter (Signed)
Refill request for general medication. Ambien sent on 11/25/17 with 2 refills too [facility].   Last office visit: 11/25/2017   Follow up on 02/25/18

## 2018-01-15 ENCOUNTER — Telehealth: Payer: Self-pay | Admitting: Family Medicine

## 2018-01-15 NOTE — Telephone Encounter (Signed)
Copied from CRM [id number]. Topic: Quick Communication - See Telephone Encounter >> Jan 15, 2018 11:49 AM Jolayne Haines L wrote: CRM for notification. See Telephone encounter for: 01/15/18.  Patient said that all the pharmacies in town are out of amphetamine-dextroamphetamine (ADDERALL) 30 MG tablet. She would like to know could that be written differently so she could it? Please advise.  [facility] #[id number] Nicholes Rough, [place] - [address] AT [facility] OF [address] [place] [address]

## 2018-01-16 ENCOUNTER — Telehealth: Payer: Self-pay | Admitting: Family Medicine

## 2018-01-16 MED ORDER — AMPHET-DEXTROAMPHET 3-BEAD ER 25 MG PO CP24: 1.0000 | ORAL_CAPSULE | ORAL | 0 refills | Status: DC

## 2018-01-16 NOTE — Telephone Encounter (Signed)
Changed to [facility]

## 2018-01-16 NOTE — Telephone Encounter (Signed)
Left detailed voicemail

## 2018-01-16 NOTE — Telephone Encounter (Unsigned)
Copied from CRM [id number]. Topic: General - Other >> Jan 16, 2018 10:22 AM Marylen Ponto wrote: Reason for CRM: Pt states the pharmacy only has the amphetamine-dextroamphetamine (ADDERALL) in either 10 mg or 20 mg tablets. Pt would like Rx sent to [facility] #[id number] Nicholes Rough,  - [address] AT [facility] OF [address] [phone] (Phone) [phone] (Fax)  Cb# [phone]

## 2018-01-16 NOTE — Telephone Encounter (Signed)
Please translate. Does she need two 15 mg daily? Where can I send it to?

## 2018-01-16 NOTE — Telephone Encounter (Signed)
Patient states that with the voucher the [facility] will still be close to $300 due to her not having health insurance. She cannot afford this. She is very tearful and does not know what to do. Please advise.

## 2018-01-17 ENCOUNTER — Other Ambulatory Visit: Payer: Self-pay

## 2018-01-17 MED ORDER — AMPHETAMINE-DEXTROAMPHETAMINE 20 MG PO TABS: 30.0000 mg | ORAL_TABLET | Freq: Two times a day (BID) | ORAL | 0 refills | Status: DC

## 2018-01-17 NOTE — Telephone Encounter (Signed)
Patient calling checking status, requesting to speak with provider or nurse. CB# [phone] or [phone]

## 2018-01-17 NOTE — Telephone Encounter (Signed)
Due to [employer] of Adderall 15 and 30 mg. [facility] does have Adderall 10 mg and 20 mg and the pharmacist recommended the patient to ask Dr. Carlynn Purl about taking 20 mg Adderal bid by doing 1.5 tablets to equal her normal 60 mg.  Called and informed pharmacy to cancel Mydavis 25 mg and Adderall 30 mg.

## 2018-01-23 ENCOUNTER — Other Ambulatory Visit: Payer: Self-pay | Admitting: Certified Nurse Midwife

## 2018-01-23 ENCOUNTER — Other Ambulatory Visit: Payer: Self-pay

## 2018-01-23 MED ORDER — NORGESTIMATE-ETH ESTRADIOL 0.25-35 MG-MCG PO TABS: 1.0000 | ORAL_TABLET | Freq: Every day | ORAL | 0 refills | Status: DC

## 2018-02-10 ENCOUNTER — Other Ambulatory Visit: Payer: Self-pay | Admitting: Family Medicine

## 2018-02-10 DIAGNOSIS — F410 Panic disorder [episodic paroxysmal anxiety] without agoraphobia: Secondary | ICD-10-CM

## 2018-02-10 DIAGNOSIS — F339 Major depressive disorder, recurrent, unspecified: Secondary | ICD-10-CM

## 2018-02-18 ENCOUNTER — Telehealth: Payer: Self-pay | Admitting: Family Medicine

## 2018-02-18 MED ORDER — AMPHETAMINE-DEXTROAMPHETAMINE 30 MG PO TABS: 30.0000 mg | ORAL_TABLET | Freq: Two times a day (BID) | ORAL | 0 refills | Status: DC

## 2018-02-18 NOTE — Telephone Encounter (Signed)
Copied from CRM [id number]. Topic: Quick Communication - See Telephone Encounter >> Feb 18, 2018 11:00 AM Jolayne Haines L wrote: CRM for notification. See Telephone encounter for: 02/18/18.  amphetamine-dextroamphetamine (ADDERALL) 30 mg tablets, patient states last month Dr Carlynn Purl sent in 20 mg for her because they were out of the 30 mg. They have the 20 mg back in stock. She needs a new script sent to the pharmacy.   [facility] #[id number] Nicholes Rough, [place] - [address] AT [facility] OF [address] [place] [address]

## 2018-02-18 NOTE — Telephone Encounter (Signed)
Sent rx to [facility]

## 2018-02-25 ENCOUNTER — Ambulatory Visit (INDEPENDENT_AMBULATORY_CARE_PROVIDER_SITE_OTHER): Payer: Self-pay | Admitting: Family Medicine

## 2018-02-25 ENCOUNTER — Other Ambulatory Visit: Payer: Self-pay | Admitting: Family Medicine

## 2018-02-25 ENCOUNTER — Encounter: Payer: Self-pay | Admitting: Family Medicine

## 2018-02-25 VITALS — BP 110/62 | HR 98 | Temp 98.2°F | Ht 65.0 in | Wt 150.9 lb

## 2018-02-25 DIAGNOSIS — G43009 Migraine without aura, not intractable, without status migrainosus: Secondary | ICD-10-CM

## 2018-02-25 DIAGNOSIS — F410 Panic disorder [episodic paroxysmal anxiety] without agoraphobia: Secondary | ICD-10-CM

## 2018-02-25 DIAGNOSIS — F339 Major depressive disorder, recurrent, unspecified: Secondary | ICD-10-CM

## 2018-02-25 DIAGNOSIS — G4700 Insomnia, unspecified: Secondary | ICD-10-CM

## 2018-02-25 DIAGNOSIS — F902 Attention-deficit hyperactivity disorder, combined type: Secondary | ICD-10-CM

## 2018-02-25 DIAGNOSIS — F411 Generalized anxiety disorder: Secondary | ICD-10-CM

## 2018-02-25 MED ORDER — AMPHETAMINE-DEXTROAMPHETAMINE 30 MG PO TABS: 30.0000 mg | ORAL_TABLET | Freq: Every day | ORAL | 0 refills | Status: DC

## 2018-02-25 MED ORDER — ZOLPIDEM TARTRATE 5 MG PO TABS: 5.0000 mg | ORAL_TABLET | Freq: Every evening | ORAL | 2 refills | Status: DC | PRN

## 2018-02-25 MED ORDER — VORTIOXETINE HBR 20 MG PO TABS: 20.0000 mg | ORAL_TABLET | Freq: Every day | ORAL | 1 refills | Status: DC

## 2018-02-25 MED ORDER — TOPIRAMATE 25 MG PO TABS: 25.0000 mg | ORAL_TABLET | Freq: Every evening | ORAL | 0 refills | Status: DC

## 2018-02-25 MED ORDER — BUSPIRONE HCL 5 MG PO TABS: 5.0000 mg | ORAL_TABLET | Freq: Three times a day (TID) | ORAL | 0 refills | Status: DC

## 2018-02-25 MED ORDER — AMPHETAMINE-DEXTROAMPHETAMINE 30 MG PO TABS: 30.0000 mg | ORAL_TABLET | Freq: Two times a day (BID) | ORAL | 0 refills | Status: DC
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
## 2018-03-18 ENCOUNTER — Other Ambulatory Visit: Payer: Self-pay | Admitting: Family Medicine

## 2018-03-18 DIAGNOSIS — F339 Major depressive disorder, recurrent, unspecified: Secondary | ICD-10-CM

## 2018-03-18 DIAGNOSIS — F410 Panic disorder [episodic paroxysmal anxiety] without agoraphobia: Secondary | ICD-10-CM

## 2018-03-19 NOTE — Telephone Encounter (Signed)
Will route to office for final disposition. 

## 2018-03-19 NOTE — Telephone Encounter (Signed)
Left message for patient to call us back about medication refill request we received.

## 2018-03-19 NOTE — Telephone Encounter (Signed)
She is supposed to be taking for panic attack only. Should last longer. I added buspar on her last visit and she has hydroxyzine

## 2018-03-20 ENCOUNTER — Other Ambulatory Visit: Payer: Self-pay | Admitting: Family Medicine

## 2018-03-20 DIAGNOSIS — G4700 Insomnia, unspecified: Secondary | ICD-10-CM

## 2018-03-26 ENCOUNTER — Other Ambulatory Visit: Payer: Self-pay

## 2018-03-28 NOTE — Telephone Encounter (Signed)
She is supposed to take prn panic attack, Buspar or hydroxyzine otherwise

## 2018-04-03 ENCOUNTER — Other Ambulatory Visit: Payer: Self-pay | Admitting: Family Medicine

## 2018-04-03 ENCOUNTER — Ambulatory Visit: Payer: Self-pay

## 2018-04-03 DIAGNOSIS — F410 Panic disorder [episodic paroxysmal anxiety] without agoraphobia: Secondary | ICD-10-CM

## 2018-04-03 DIAGNOSIS — F339 Major depressive disorder, recurrent, unspecified: Secondary | ICD-10-CM

## 2018-04-03 MED ORDER — FLUCONAZOLE 150 MG PO TABS: 150.0000 mg | ORAL_TABLET | ORAL | 0 refills | Status: DC

## 2018-04-03 NOTE — Addendum Note (Signed)
Addended by: Ruel FavorsSOWLES, Mirza Kidney F on: 04/03/2018 06:22 PM   Modules accepted: Orders

## 2018-04-03 NOTE — Telephone Encounter (Signed)
Sent diflucan

## 2018-04-03 NOTE — Telephone Encounter (Signed)
Returned call to patient to triage symptoms. Pt states that her vaginal discharge started 1 week ago. She states that the itching her worst symptom.  She describes the discharge as white cottage cheese. She states there is redness. No rash.  She has no abdominal pain.  She is requesting rx for diflucan. Pt wants Dr Carlynn PurlSowles to know she stopped Buspar. It caused nausea chest pain and increase in her anxiety.  She would like to start Clonazepam. Pt refused office visit until contacted by office. She has no insurance and does not want to pay for appointment if these changes could be phoned in for her. Reason for Disposition . [1] Symptoms of a "yeast infection" (i.e., itchy, white discharge, not bad smelling) AND [2] not improved > 3 days following CARE ADVICE  Answer Assessment - Initial Assessment Questions 1. DISCHARGE: "Describe the discharge." (e.g., white, yellow, green, gray, foamy, cottage cheese-like)     White cottage cheese 2. ODOR: "Is there a bad odor?"     no 3. ONSET: "When did the discharge begin?"     1 week 4. RASH: "Is there a rash in that area?" If so, ask: "Describe it." (e.g., redness, blisters, sores, bumps)     redness 5. ABDOMINAL PAIN: "Are you having any abdominal pain?" If yes: "What does it feel like? " (e.g., crampy, dull, intermittent, constant)      no 6. ABDOMINAL PAIN SEVERITY: If present, ask: "How bad is it?"  (e.g., mild, moderate, severe)  - MILD - doesn't interfere with normal activities   - MODERATE - interferes with normal activities or awakens from sleep   - SEVERE - patient doesn't want to move (R/O peritonitis)      Moderate with itching 7. CAUSE: "What do you think is causing the discharge?" "Have you had the same problem before? What happened then?"     Yeast infection 8. OTHER SYMPTOMS: "Do you have any other symptoms?" (e.g., fever, itching, vaginal bleeding, pain with urination, injury to genital area, vaginal foreign body)     itching 9.  PREGNANCY: "Is there any chance you are pregnant?" "When was your last menstrual period?"     No Preventative  Protocols used: VAGINAL DISCHARGE-A-AH

## 2018-04-03 NOTE — Telephone Encounter (Signed)
Copied from CRM [id number] [id number]. Topic: Quick Communication - Rx Refill/Question >> Apr 03, 2018 10:53 AM Jaquita Rectoravis, Karen A wrote: Medication: clonazePAM (KLONOPIN) 0.5 MG tablet   Has the patient contacted their pharmacy? Yes.   (Agent: If no, request that the patient contact the pharmacy for the refill.) (Agent: If yes, when and what did the pharmacy advise?)  Preferred Pharmacy (with phone number or street name[id number] Nicholes Rough- [place], [place] - [address] AT [facility] OF [address] [phone] (Phone) [phone] (Fax)    Agent: Please be advised that RX refills may take up to 3 business days. We ask that you follow-up with your pharmacy.

## 2018-04-04 MED ORDER — CLONAZEPAM 0.5 MG PO TABS: 0.5000 mg | ORAL_TABLET | Freq: Every day | ORAL | 0 refills | Status: DC | PRN

## 2018-04-24 ENCOUNTER — Other Ambulatory Visit: Payer: Self-pay | Admitting: Certified Nurse Midwife

## 2018-05-16 ENCOUNTER — Telehealth: Payer: Self-pay | Admitting: Family Medicine

## 2018-05-16 DIAGNOSIS — F339 Major depressive disorder, recurrent, unspecified: Secondary | ICD-10-CM

## 2018-05-16 DIAGNOSIS — F410 Panic disorder [episodic paroxysmal anxiety] without agoraphobia: Secondary | ICD-10-CM

## 2018-05-16 NOTE — Telephone Encounter (Signed)
Refill request for general medication. Clonazepam to [facility].   Last office visit 02/25/2018   Follow up on 05/26/2018

## 2018-05-21 NOTE — Telephone Encounter (Signed)
Patient calling to check status of refill. States that she last received the refill on 04/04/18 as a 30 day supply and is just now out of the medication. Patient would like enough medication to last until appointment on 05/26/2018. Please advise.

## 2018-05-23 NOTE — Telephone Encounter (Signed)
Patient is returning call from tiffany stated if she try her again on work number.

## 2018-05-23 NOTE — Telephone Encounter (Signed)
Called patient work phone number to tell her to call us back.

## 2018-05-23 NOTE — Telephone Encounter (Signed)
She has follow up in 3 days. I was going to fill on her next visit

## 2018-05-23 NOTE — Telephone Encounter (Addendum)
Patient scheduled for 05/26/2018 with PCP for a medication follow up. Patient would like a follow up call today regarding why her clonazePAM (KLONOPIN) 0.5 MG tablet   was refused, please advise best # [phone] (W) and then [phone] (M)

## 2018-05-26 ENCOUNTER — Ambulatory Visit: Payer: BC Managed Care – PPO | Admitting: Family Medicine

## 2018-05-26 ENCOUNTER — Encounter: Payer: Self-pay | Admitting: Family Medicine

## 2018-05-26 ENCOUNTER — Telehealth: Payer: Self-pay | Admitting: Family Medicine

## 2018-05-26 ENCOUNTER — Other Ambulatory Visit: Payer: Self-pay | Admitting: Family Medicine

## 2018-05-26 VITALS — BP 110/80 | HR 115 | Temp 97.6°F | Resp 16 | Ht 65.0 in | Wt 147.0 lb

## 2018-05-26 DIAGNOSIS — F411 Generalized anxiety disorder: Secondary | ICD-10-CM | POA: Diagnosis not present

## 2018-05-26 DIAGNOSIS — F410 Panic disorder [episodic paroxysmal anxiety] without agoraphobia: Secondary | ICD-10-CM

## 2018-05-26 DIAGNOSIS — G4700 Insomnia, unspecified: Secondary | ICD-10-CM

## 2018-05-26 DIAGNOSIS — F339 Major depressive disorder, recurrent, unspecified: Secondary | ICD-10-CM | POA: Diagnosis not present

## 2018-05-26 DIAGNOSIS — F902 Attention-deficit hyperactivity disorder, combined type: Secondary | ICD-10-CM

## 2018-05-26 DIAGNOSIS — B373 Candidiasis of vulva and vagina: Secondary | ICD-10-CM

## 2018-05-26 DIAGNOSIS — B3731 Acute candidiasis of vulva and vagina: Secondary | ICD-10-CM

## 2018-05-26 DIAGNOSIS — G43009 Migraine without aura, not intractable, without status migrainosus: Secondary | ICD-10-CM

## 2018-05-26 MED ORDER — CLONAZEPAM 0.5 MG PO TABS: 0.5000 mg | ORAL_TABLET | Freq: Every day | ORAL | 0 refills | Status: DC | PRN

## 2018-05-26 MED ORDER — ZOLPIDEM TARTRATE 5 MG PO TABS: 5.0000 mg | ORAL_TABLET | Freq: Every evening | ORAL | 2 refills | Status: DC | PRN

## 2018-05-26 MED ORDER — VORTIOXETINE HBR 20 MG PO TABS: 20.0000 mg | ORAL_TABLET | Freq: Every day | ORAL | 1 refills | Status: DC

## 2018-05-26 MED ORDER — AMPHETAMINE-DEXTROAMPHETAMINE 30 MG PO TABS: 30.0000 mg | ORAL_TABLET | Freq: Two times a day (BID) | ORAL | 0 refills | Status: DC

## 2018-05-26 MED ORDER — ATENOLOL 25 MG PO TABS: 25.0000 mg | ORAL_TABLET | Freq: Every evening | ORAL | 0 refills | Status: DC

## 2018-05-26 MED ORDER — FLUCONAZOLE 150 MG PO TABS: 150.0000 mg | ORAL_TABLET | Freq: Once | ORAL | 0 refills | Status: AC

## 2018-05-26 NOTE — Telephone Encounter (Signed)
Copied from CRM [id number]. Topic: Quick Communication - See Telephone Encounter >> May 26, 2018 10:01 AM Trula Slade wrote: CRM for notification. See Telephone encounter for: 05/26/18. Patient was in to see the provider today and was told she had a refill left on her amphetamine-dextroamphetamine (ADDERALL) 30 MG tablet medication at her pharmacy [facility] on [address], but the pharmacy has the wrong frequency on their prescription.  The pharmacy would like a new prescription sent to them with the correct frequency.  Patient takes two tablets a day.
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
## 2018-05-27 ENCOUNTER — Telehealth: Payer: Self-pay

## 2018-05-27 DIAGNOSIS — F902 Attention-deficit hyperactivity disorder, combined type: Secondary | ICD-10-CM

## 2018-05-27 NOTE — Telephone Encounter (Signed)
Prior Authorization initiated and Approved for Adderall 30 mg. As long as she remains covered by the [facility] and there are no changes to her benefits, This request is approved for the following time period:   05/27/2018-05/27/2021  Last refill date in February per pharmacy.

## 2018-05-28 NOTE — Telephone Encounter (Signed)
Patient calling regarding prior auth for this medication. Patient is requesting a call back to discuss. Please advise.

## 2018-05-29 NOTE — Telephone Encounter (Signed)
Called Pharmacy to see if they had filled Rx because Adderrall was covered. Called to inform patient that I had spoke with pharmacy. Pharmacy is suppose to call her when medication is ready for pickup.

## 2018-06-30 ENCOUNTER — Telehealth: Payer: Self-pay | Admitting: Family Medicine

## 2018-06-30 NOTE — Telephone Encounter (Signed)
Copied from CRM [id number]. Topic: Quick Communication - Rx Refill/Question >> Jun 30, 2018  3:44 PM Jilda Roche wrote: Medication: zolpidem (AMBIEN) 5 MG tablet  Has the patient contacted their pharmacy? Yes.   (Agent: If no, request that the patient contact the pharmacy for the refill.) (Agent: If yes, when and what did the pharmacy advise?) Needs prior auth  Preferred Pharmacy (with phone number or street name): [id number] [place], [place] - [address] AT [facility] OF [address] [phone] (Phone) [phone] (Fax)    Agent: Please be advised that RX refills may take up to 3 business days. We ask that you follow-up with your pharmacy.

## 2018-06-30 NOTE — Telephone Encounter (Signed)
[facility] not doing refills for [facility].

## 2018-06-30 NOTE — Telephone Encounter (Signed)
PA was approved for patient Ambien 5 mg and patient and pharmacy notified. Pharmacy will fill it for her and notify her it is ready for pick up.

## 2018-08-05 ENCOUNTER — Ambulatory Visit: Payer: Self-pay

## 2018-08-05 ENCOUNTER — Telehealth: Payer: Self-pay | Admitting: Family Medicine

## 2018-08-05 NOTE — Telephone Encounter (Signed)
Returned call to patient who states that she has been tested today for COVID-19.  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  She denies fever and SOB. She is requesting [employer] office visit.  Home care instructions read to patient and included monitoring for fever 2x day. Social distancing cover cough and sneeze. Hand washing and sanitizing. Reason for Disposition . [1] No COVID-19 EXPOSURE BUT [2] living with someone who was exposed and who has no fever or cough  Answer Assessment - Initial Assessment Questions 1. CONFIRMED CASE: "Who is the person with the confirmed COVID-19 infection that you were exposed to?"     none 2. PLACE of CONTACT: "Where were you when you were exposed to COVID-19  (coronavirus disease 2019)?" (e.g., city, state, country)     Work in health care 3. TYPE of CONTACT: "How much contact was there?" (e.g., live in same house, work in same office, same school)     Unknown 4. DATE of CONTACT: "When did you have contact with a coronavirus patient?" (e.g., days)     unknown 5. DURATION of CONTACT: "How long were you in contact with the COVID-19 (coronavirus disease) patient?" (e.g., a few seconds, passed by person, a few minutes, live with the patient)     unknown 6. SYMPTOMS: "Do you have any symptoms?" (e.g., fever, cough, breathing difficulty)     Cough, hoarse voice, 7. PREGNANCY OR POSTPARTUM: "Is there any chance you are pregnant?" "When was your last menstrual period?" "Did you deliver in the last 2 weeks?"     No menses every 3 months 8. HIGH RISK: "Do you have any heart or lung problems? Do you have a weakened immune system?" (e.g., CHF, COPD, asthma, HIV positive, chemotherapy, renal failure, diabetes mellitus, sickle cell anemia)     no  Protocols used: CORONAVIRUS (COVID-19) EXPOSURE-A-AH

## 2018-08-05 NOTE — Telephone Encounter (Signed)
Copied from CRM [id number]. Topic: General - Other >> Aug 05, 2018  4:53 PM Monica Bonilla wrote: Reason for CRM: Patient called and stated that she has been tested for Covid but she is still suffering from a cough and laryngitis. Her employer told her to isolate until the results come back but she will still need a Dr's note (whether the test is positive or negative) to return to work. She would like to know what Dr. Carlynn Purl would advise

## 2018-08-07 ENCOUNTER — Ambulatory Visit (INDEPENDENT_AMBULATORY_CARE_PROVIDER_SITE_OTHER): Payer: BC Managed Care – PPO | Admitting: Family Medicine

## 2018-08-07 ENCOUNTER — Encounter: Payer: Self-pay | Admitting: Family Medicine

## 2018-08-07 VITALS — Temp 97.4°F

## 2018-08-07 DIAGNOSIS — J069 Acute upper respiratory infection, unspecified: Secondary | ICD-10-CM

## 2018-08-07 MED ORDER — MAGIC MOUTHWASH W/LIDOCAINE: 5.0000 mL | Freq: Four times a day (QID) | ORAL | 0 refills | Status: DC | PRN

## 2018-08-07 MED ORDER — ALBUTEROL SULFATE HFA 108 (90 BASE) MCG/ACT IN AERS: 2.0000 | INHALATION_SPRAY | Freq: Four times a day (QID) | RESPIRATORY_TRACT | 0 refills | Status: DC | PRN

## 2018-08-07 MED ORDER — BENZONATATE 100 MG PO CAPS: 100.0000 mg | ORAL_CAPSULE | Freq: Two times a day (BID) | ORAL | 0 refills | Status: DC | PRN
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
## 2018-08-12 ENCOUNTER — Telehealth: Payer: Self-pay | Admitting: Family Medicine

## 2018-08-12 NOTE — Telephone Encounter (Signed)
Copied from CRM [id number]. Topic: Quick Communication - Rx Refill/Question >> Aug 12, 2018  1:41 PM Rica Koyanagi, Barbee Cough wrote: Medication: magic mouthwash w/lidocaine SOLN  Has the patient contacted their pharmacy? Yes.   (Agent: If no, request that the patient contact the pharmacy for the refill.) (Agent: If yes, when and what did the pharmacy advise?)  Preferred Pharmacy (with phone number or street name): [facility]  Pharmacy needs to know how much of each ingredient to use in the mouthwash     Agent: Please be advised that RX refills may take up to 3 business days. We ask that you follow-up with your pharmacy.

## 2018-08-13 NOTE — Telephone Encounter (Signed)
Pharmacist states one part of hydrocortisone is not a mixture they put in magic mouth wash. He needs a call back from the physician to get clarifications.   Instructions: One part eat of lidocaine, maalox and hydrocortisone

## 2018-08-14 NOTE — Telephone Encounter (Signed)
Called pharmacy x3 and was put on hold then hung up on

## 2018-08-15 NOTE — Telephone Encounter (Signed)
Spoke with [facility] pharmacy went by standard protocol. Needed the dosage of lidocaine

## 2018-09-02 ENCOUNTER — Ambulatory Visit (INDEPENDENT_AMBULATORY_CARE_PROVIDER_SITE_OTHER): Payer: BC Managed Care – PPO | Admitting: Family Medicine

## 2018-09-02 ENCOUNTER — Other Ambulatory Visit: Payer: Self-pay

## 2018-09-02 ENCOUNTER — Encounter: Payer: Self-pay | Admitting: Family Medicine

## 2018-09-02 VITALS — HR 103

## 2018-09-02 DIAGNOSIS — F339 Major depressive disorder, recurrent, unspecified: Secondary | ICD-10-CM | POA: Diagnosis not present

## 2018-09-02 DIAGNOSIS — G43009 Migraine without aura, not intractable, without status migrainosus: Secondary | ICD-10-CM

## 2018-09-02 DIAGNOSIS — G4700 Insomnia, unspecified: Secondary | ICD-10-CM

## 2018-09-02 DIAGNOSIS — F411 Generalized anxiety disorder: Secondary | ICD-10-CM

## 2018-09-02 DIAGNOSIS — F902 Attention-deficit hyperactivity disorder, combined type: Secondary | ICD-10-CM | POA: Diagnosis not present

## 2018-09-02 MED ORDER — BUTALBITAL-APAP-CAFFEINE 50-325-40 MG PO TABS: 1.0000 | ORAL_TABLET | Freq: Four times a day (QID) | ORAL | 0 refills | Status: DC | PRN

## 2018-09-02 MED ORDER — AMPHETAMINE-DEXTROAMPHETAMINE 30 MG PO TABS: 30.0000 mg | ORAL_TABLET | Freq: Two times a day (BID) | ORAL | 0 refills | Status: DC

## 2018-09-02 MED ORDER — ATENOLOL 25 MG PO TABS: 25.0000 mg | ORAL_TABLET | Freq: Every evening | ORAL | 0 refills | Status: DC

## 2018-09-02 MED ORDER — ZOLPIDEM TARTRATE 5 MG PO TABS: 5.0000 mg | ORAL_TABLET | Freq: Every evening | ORAL | 2 refills | Status: DC | PRN
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
## 2018-10-08 ENCOUNTER — Other Ambulatory Visit: Payer: Self-pay | Admitting: Family Medicine

## 2018-10-08 DIAGNOSIS — F339 Major depressive disorder, recurrent, unspecified: Secondary | ICD-10-CM

## 2018-10-08 DIAGNOSIS — F410 Panic disorder [episodic paroxysmal anxiety] without agoraphobia: Secondary | ICD-10-CM

## 2018-10-08 DIAGNOSIS — F411 Generalized anxiety disorder: Secondary | ICD-10-CM

## 2018-10-08 DIAGNOSIS — G43009 Migraine without aura, not intractable, without status migrainosus: Secondary | ICD-10-CM

## 2018-10-08 DIAGNOSIS — G4700 Insomnia, unspecified: Secondary | ICD-10-CM

## 2018-11-25 ENCOUNTER — Encounter: Payer: Self-pay | Admitting: Family Medicine

## 2018-11-25 ENCOUNTER — Other Ambulatory Visit: Payer: Self-pay

## 2018-11-25 ENCOUNTER — Ambulatory Visit (INDEPENDENT_AMBULATORY_CARE_PROVIDER_SITE_OTHER): Payer: BC Managed Care – PPO | Admitting: Family Medicine

## 2018-11-25 VITALS — BP 110/80 | HR 132

## 2018-11-25 DIAGNOSIS — F339 Major depressive disorder, recurrent, unspecified: Secondary | ICD-10-CM

## 2018-11-25 DIAGNOSIS — R000 Tachycardia, unspecified: Secondary | ICD-10-CM

## 2018-11-25 DIAGNOSIS — F410 Panic disorder [episodic paroxysmal anxiety] without agoraphobia: Secondary | ICD-10-CM

## 2018-11-25 DIAGNOSIS — Z13220 Encounter for screening for lipoid disorders: Secondary | ICD-10-CM

## 2018-11-25 DIAGNOSIS — R5383 Other fatigue: Secondary | ICD-10-CM

## 2018-11-25 DIAGNOSIS — G43009 Migraine without aura, not intractable, without status migrainosus: Secondary | ICD-10-CM

## 2018-11-25 DIAGNOSIS — F902 Attention-deficit hyperactivity disorder, combined type: Secondary | ICD-10-CM | POA: Diagnosis not present

## 2018-11-25 DIAGNOSIS — G4700 Insomnia, unspecified: Secondary | ICD-10-CM

## 2018-11-25 DIAGNOSIS — F411 Generalized anxiety disorder: Secondary | ICD-10-CM

## 2018-11-25 DIAGNOSIS — Z131 Encounter for screening for diabetes mellitus: Secondary | ICD-10-CM

## 2018-11-25 MED ORDER — BUTALBITAL-APAP-CAFFEINE 50-325-40 MG PO TABS: 1.0000 | ORAL_TABLET | ORAL | 0 refills | Status: DC | PRN

## 2018-11-25 MED ORDER — AMPHETAMINE-DEXTROAMPHETAMINE 30 MG PO TABS: 30.0000 mg | ORAL_TABLET | Freq: Two times a day (BID) | ORAL | 0 refills | Status: DC

## 2018-11-25 MED ORDER — VORTIOXETINE HBR 20 MG PO TABS: 20.0000 mg | ORAL_TABLET | Freq: Every day | ORAL | 1 refills | Status: DC

## 2018-11-25 MED ORDER — ZOLPIDEM TARTRATE 5 MG PO TABS: 5.0000 mg | ORAL_TABLET | Freq: Every evening | ORAL | 2 refills | Status: DC | PRN

## 2018-11-25 MED ORDER — ATENOLOL 25 MG PO TABS: 25.0000 mg | ORAL_TABLET | Freq: Two times a day (BID) | ORAL | 0 refills | Status: DC
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
## 2018-12-02 ENCOUNTER — Ambulatory Visit: Payer: BC Managed Care – PPO | Admitting: Family Medicine

## 2018-12-08 ENCOUNTER — Other Ambulatory Visit: Payer: Self-pay | Admitting: Family Medicine

## 2018-12-08 DIAGNOSIS — G4700 Insomnia, unspecified: Secondary | ICD-10-CM

## 2018-12-27 LAB — CBC WITH DIFFERENTIAL/PLATELET
Absolute Monocytes: 362 cells/uL (ref 200–950)
Basophils Absolute: 51 cells/uL (ref 0–200)
Basophils Relative: 1 %
Eosinophils Absolute: 31 cells/uL (ref 15–500)
Eosinophils Relative: 0.6 %
HCT: 38.4 % (ref 35.0–45.0)
Hemoglobin: 12.9 g/dL (ref 11.7–15.5)
Lymphs Abs: 1525 cells/uL (ref 850–3900)
MCH: 30 pg (ref 27.0–33.0)
MCHC: 33.6 g/dL (ref 32.0–36.0)
MCV: 89.3 fL (ref 80.0–100.0)
MPV: 10.7 fL (ref 7.5–12.5)
Monocytes Relative: 7.1 %
Neutro Abs: 3131 cells/uL (ref 1500–7800)
Neutrophils Relative %: 61.4 %
Platelets: 343 10*3/uL (ref 140–400)
RBC: 4.3 10*6/uL (ref 3.80–5.10)
RDW: 12.1 % (ref 11.0–15.0)
Total Lymphocyte: 29.9 %
WBC: 5.1 10*3/uL (ref 3.8–10.8)

## 2018-12-27 LAB — COMPLETE METABOLIC PANEL WITH GFR
AG Ratio: 1.8 (calc) (ref 1.0–2.5)
ALT: 10 U/L (ref 6–29)
AST: 10 U/L (ref 10–30)
Albumin: 4.2 g/dL (ref 3.6–5.1)
Alkaline phosphatase (APISO): 67 U/L (ref 31–125)
BUN: 9 mg/dL (ref 7–25)
CO2: 25 mmol/L (ref 20–32)
Calcium: 9.1 mg/dL (ref 8.6–10.2)
Chloride: 105 mmol/L (ref 98–110)
Creat: 0.82 mg/dL (ref 0.50–1.10)
GFR, Est African American: 107 mL/min/{1.73_m2} (ref >=60)
GFR, Est Non African American: 92 mL/min/{1.73_m2} (ref >=60)
Globulin: 2.4 g/dL (calc) (ref 1.9–3.7)
Glucose, Bld: 107 mg/dL — ABNORMAL HIGH (ref 65–99)
Potassium: 4.1 mmol/L (ref 3.5–5.3)
Sodium: 138 mmol/L (ref 135–146)
Total Bilirubin: 0.2 mg/dL (ref 0.2–1.2)
Total Protein: 6.6 g/dL (ref 6.1–8.1)

## 2018-12-27 LAB — TSH: TSH: 2.26 mIU/L

## 2018-12-27 LAB — LIPID PANEL
Cholesterol: 172 mg/dL (ref <200)
HDL: 68 mg/dL (ref >=50)
LDL Cholesterol (Calc): 90 mg/dL (calc)
Non-HDL Cholesterol (Calc): 104 mg/dL (calc) (ref <130)
Total CHOL/HDL Ratio: 2.5 (calc) (ref <5.0)
Triglycerides: 49 mg/dL (ref <150)

## 2018-12-27 LAB — HEMOGLOBIN A1C
Hgb A1c MFr Bld: 5.3 % of total Hgb (ref <5.7)
Mean Plasma Glucose: 105 (calc)
eAG (mmol/L): 5.8 (calc)

## 2018-12-27 LAB — VITAMIN B12: Vitamin B-12: 325 pg/mL (ref 200–1100)

## 2019-02-09 ENCOUNTER — Other Ambulatory Visit: Payer: Self-pay | Admitting: Family Medicine

## 2019-02-09 DIAGNOSIS — G43009 Migraine without aura, not intractable, without status migrainosus: Secondary | ICD-10-CM

## 2019-02-09 DIAGNOSIS — F411 Generalized anxiety disorder: Secondary | ICD-10-CM

## 2019-02-13 ENCOUNTER — Other Ambulatory Visit: Payer: Self-pay | Admitting: Family Medicine

## 2019-02-13 DIAGNOSIS — G43009 Migraine without aura, not intractable, without status migrainosus: Secondary | ICD-10-CM

## 2019-02-16 ENCOUNTER — Telehealth: Payer: Self-pay | Admitting: Family Medicine

## 2019-02-16 DIAGNOSIS — G43009 Migraine without aura, not intractable, without status migrainosus: Secondary | ICD-10-CM

## 2019-02-16 MED ORDER — BUTALBITAL-APAP-CAFFEINE 50-325-40 MG PO TABS: 1.0000 | ORAL_TABLET | ORAL | 0 refills | Status: DC | PRN

## 2019-02-16 NOTE — Telephone Encounter (Signed)
Patient scheduled appointment. Patient is requesting a partial fill until this appointment. Patient currently has a migraine.

## 2019-02-16 NOTE — Telephone Encounter (Signed)
lvm for pt to schedule appt with Dr Ancil Boozer

## 2019-02-16 NOTE — Telephone Encounter (Signed)
Medication Refill - Medication:butalbital-acetaminophen-caffeine (FIORICET) 50-325-40 MG tablet [[id number]]    Has the patient contacted their pharmacy? yes (Agent: If no, request that the patient contact the pharmacy for the refill.) (Agent: If yes, when and what did the pharmacy advise?) contact us directly   Preferred Pharmacy (with phone number or street name): [id number], [place]  [place] [address]  Phone: [phone] Fax: [phone]  Not a 24 hour pharmacy; exact hours not known.     Agent: Please be advised that RX refills may take up to 3 business days. We ask that you follow-up with your pharmacy.\

## 2019-02-23 ENCOUNTER — Encounter: Payer: Self-pay | Admitting: Family Medicine

## 2019-02-23 ENCOUNTER — Ambulatory Visit (INDEPENDENT_AMBULATORY_CARE_PROVIDER_SITE_OTHER): Payer: Self-pay | Admitting: Family Medicine

## 2019-02-23 VITALS — BP 108/72 | HR 100 | Temp 97.2°F | Wt 148.4 lb

## 2019-02-23 DIAGNOSIS — G4700 Insomnia, unspecified: Secondary | ICD-10-CM

## 2019-02-23 DIAGNOSIS — F902 Attention-deficit hyperactivity disorder, combined type: Secondary | ICD-10-CM

## 2019-02-23 DIAGNOSIS — F339 Major depressive disorder, recurrent, unspecified: Secondary | ICD-10-CM

## 2019-02-23 DIAGNOSIS — F411 Generalized anxiety disorder: Secondary | ICD-10-CM

## 2019-02-23 DIAGNOSIS — F410 Panic disorder [episodic paroxysmal anxiety] without agoraphobia: Secondary | ICD-10-CM

## 2019-02-23 DIAGNOSIS — G43009 Migraine without aura, not intractable, without status migrainosus: Secondary | ICD-10-CM

## 2019-02-23 MED ORDER — ZOLPIDEM TARTRATE 5 MG PO TABS: 5.0000 mg | ORAL_TABLET | Freq: Every evening | ORAL | 2 refills | Status: DC | PRN

## 2019-02-23 MED ORDER — CLONAZEPAM 0.5 MG PO TABS: 0.5000 mg | ORAL_TABLET | Freq: Every day | ORAL | 0 refills | Status: DC | PRN

## 2019-02-23 MED ORDER — AMPHETAMINE-DEXTROAMPHETAMINE 30 MG PO TABS: 30.0000 mg | ORAL_TABLET | Freq: Two times a day (BID) | ORAL | 0 refills | Status: DC

## 2019-02-23 MED ORDER — UBRELVY 100 MG PO TABS: 1.0000 | ORAL_TABLET | Freq: Every day | ORAL | 2 refills | Status: DC | PRN

## 2019-02-23 MED ORDER — BUTALBITAL-APAP-CAFFEINE 50-325-40 MG PO TABS: 1.0000 | ORAL_TABLET | ORAL | 0 refills | Status: DC | PRN
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
## 2019-03-12 ENCOUNTER — Other Ambulatory Visit: Payer: Self-pay | Admitting: Family Medicine

## 2019-03-12 DIAGNOSIS — F410 Panic disorder [episodic paroxysmal anxiety] without agoraphobia: Secondary | ICD-10-CM

## 2019-03-12 DIAGNOSIS — F339 Major depressive disorder, recurrent, unspecified: Secondary | ICD-10-CM

## 2019-03-12 DIAGNOSIS — G4700 Insomnia, unspecified: Secondary | ICD-10-CM

## 2019-03-12 NOTE — Telephone Encounter (Signed)
Requested medication (s) are due for refill today: no  Requested medication (s) are on the active medication list: yes  Last refill: 02/23/2019  Future visit scheduled: no  Notes to clinic: refill cannot be delegated    Requested Prescriptions  Pending Prescriptions Disp Refills   clonazePAM (KLONOPIN) 0.5 MG tablet [Pharmacy Med Name: CLONAZEPAM 0.5MG  TABLETS] 30 tablet     Sig: TAKE 1 TABLET(0.5 MG) BY MOUTH DAILY AS NEEDED. MUST LAST 3 MONTHS     Not Delegated - Psychiatry:  Anxiolytics/Hypnotics Failed - 03/12/2019  2:59 PM      Failed - This refill cannot be delegated      Failed - Urine Drug Screen completed in last 360 days.      Passed - Valid encounter within last 6 months    Recent Outpatient Visits          2 weeks ago Attention deficit hyperactivity disorder (ADHD), combined type   [facility] Steele Sizer, MD   3 months ago Tachycardia   [facility] [place], Drue Stager, MD   6 months ago Chronic recurrent major depressive disorder [facility])   [facility] Steele Sizer, MD   7 months ago Acute URI   [facility] [place], Drue Stager, MD   9 months ago Chronic recurrent major depressive disorder [facility])   [facility] [place], Drue Stager, MD              zolpidem (AMBIEN) 5 MG tablet [Pharmacy Med Name: ZOLPIDEM 5MG  TABLETS] 30 tablet     Sig: TAKE 1 TABLET(5 MG) BY MOUTH AT BEDTIME AS NEEDED FOR SLEEP     Not Delegated - Psychiatry:  Anxiolytics/Hypnotics Failed - 03/12/2019  2:59 PM      Failed - This refill cannot be delegated      Failed - Urine Drug Screen completed in last 360 days.      Passed - Valid encounter within last 6 months    Recent Outpatient Visits          2 weeks ago Attention deficit hyperactivity disorder (ADHD), combined type   [facility] Steele Sizer, MD   3 months ago Tachycardia   [facility]  Steele Sizer, MD   6 months ago Chronic recurrent major depressive disorder [facility])   [facility] Steele Sizer, MD   7 months ago Acute URI   [facility] Steele Sizer, MD   9 months ago Chronic recurrent major depressive disorder [facility])   [facility] Steele Sizer, MD

## 2019-05-01 ENCOUNTER — Other Ambulatory Visit: Payer: Self-pay | Admitting: Family Medicine

## 2019-05-01 DIAGNOSIS — G43009 Migraine without aura, not intractable, without status migrainosus: Secondary | ICD-10-CM

## 2019-05-01 NOTE — Telephone Encounter (Signed)
Requested medication (s) are due for refill today:yes  Requested medication (s) are on the active medication list: yes   Last refill: 02/23/2019   #30 0 refills  Future visit scheduled no  Notes to clinic:not delegated  Requested Prescriptions  Pending Prescriptions Disp Refills   butalbital-acetaminophen-caffeine (FIORICET) 50-325-40 MG tablet [Pharmacy Med Name: BUTALBITAL/ACETAMINOPHEN/CAFF TABS] 30 tablet     Sig: TAKE 1 TABLET BY MOUTH EVERY 4 HOURS AS NEEDED FOR HEADACHE      Not Delegated - Analgesics:  Non-Opioid Analgesic Combinations Failed - 05/01/2019 12:09 PM      Failed - This refill cannot be delegated      Passed - Valid encounter within last 12 months    Recent Outpatient Visits           2 months ago Attention deficit hyperactivity disorder (ADHD), combined type   [facility] Steele Sizer, MD   5 months ago Tachycardia   [facility] [place], Drue Stager, MD   8 months ago Chronic recurrent major depressive disorder [facility])   [facility] Steele Sizer, MD   8 months ago Acute URI   [facility] [place], Drue Stager, MD   11 months ago Chronic recurrent major depressive disorder [facility])   [facility] Steele Sizer, MD

## 2019-05-20 ENCOUNTER — Other Ambulatory Visit: Payer: Self-pay | Admitting: Family Medicine

## 2019-05-20 DIAGNOSIS — G43009 Migraine without aura, not intractable, without status migrainosus: Secondary | ICD-10-CM

## 2019-05-20 DIAGNOSIS — F902 Attention-deficit hyperactivity disorder, combined type: Secondary | ICD-10-CM

## 2019-05-21 ENCOUNTER — Other Ambulatory Visit: Payer: Self-pay | Admitting: Family Medicine

## 2019-05-21 ENCOUNTER — Telehealth: Payer: Self-pay

## 2019-05-21 ENCOUNTER — Encounter: Payer: Self-pay | Admitting: Family Medicine

## 2019-05-21 DIAGNOSIS — G43009 Migraine without aura, not intractable, without status migrainosus: Secondary | ICD-10-CM

## 2019-05-21 MED ORDER — BUTALBITAL-APAP-CAFFEINE 50-325-40 MG PO TABS: 1.0000 | ORAL_TABLET | ORAL | 0 refills | Status: DC | PRN

## 2019-05-21 NOTE — Telephone Encounter (Signed)
Copied from CRM #[id number]. Topic: General - Other >> May 21, 2019 11:49 AM [name], [name] wrote: reason for CRM: pt has an appointment on 05-27-2019 and has sent mychart message to dr sowles. Pt would like refills today 

## 2019-05-21 NOTE — Telephone Encounter (Signed)
Copied from CRM [id number]. Topic: General - Other >> May 21, 2019 11:49 AM Leafy Ro wrote: reason for CRM: pt has an appointment on 05-27-2019 and has sent mychart message to dr Carlynn Purl. Pt would like refills today

## 2019-05-27 ENCOUNTER — Ambulatory Visit (INDEPENDENT_AMBULATORY_CARE_PROVIDER_SITE_OTHER): Payer: Self-pay | Admitting: Family Medicine

## 2019-05-27 ENCOUNTER — Encounter: Payer: Self-pay | Admitting: Family Medicine

## 2019-05-27 ENCOUNTER — Other Ambulatory Visit: Payer: Self-pay

## 2019-05-27 DIAGNOSIS — F410 Panic disorder [episodic paroxysmal anxiety] without agoraphobia: Secondary | ICD-10-CM

## 2019-05-27 DIAGNOSIS — G43009 Migraine without aura, not intractable, without status migrainosus: Secondary | ICD-10-CM

## 2019-05-27 DIAGNOSIS — F411 Generalized anxiety disorder: Secondary | ICD-10-CM

## 2019-05-27 DIAGNOSIS — G4700 Insomnia, unspecified: Secondary | ICD-10-CM

## 2019-05-27 DIAGNOSIS — F339 Major depressive disorder, recurrent, unspecified: Secondary | ICD-10-CM

## 2019-05-27 DIAGNOSIS — F902 Attention-deficit hyperactivity disorder, combined type: Secondary | ICD-10-CM

## 2019-05-27 MED ORDER — ATENOLOL 25 MG PO TABS: 25.0000 mg | ORAL_TABLET | Freq: Two times a day (BID) | ORAL | 1 refills | Status: DC

## 2019-05-27 MED ORDER — AMPHETAMINE-DEXTROAMPHETAMINE 30 MG PO TABS: 30.0000 mg | ORAL_TABLET | Freq: Two times a day (BID) | ORAL | 0 refills | Status: DC

## 2019-05-27 MED ORDER — VORTIOXETINE HBR 20 MG PO TABS: 20.0000 mg | ORAL_TABLET | Freq: Every day | ORAL | 1 refills | Status: DC

## 2019-05-27 MED ORDER — ZOLPIDEM TARTRATE 5 MG PO TABS: 5.0000 mg | ORAL_TABLET | Freq: Every evening | ORAL | 2 refills | Status: DC | PRN

## 2019-05-27 MED ORDER — CLONAZEPAM 0.5 MG PO TABS: 0.5000 mg | ORAL_TABLET | Freq: Every day | ORAL | 0 refills | Status: DC | PRN

## 2019-05-27 MED ORDER — PREDNISONE 10 MG PO TABS: 10.0000 mg | ORAL_TABLET | Freq: Every day | ORAL | 0 refills | Status: DC | PRN
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
## 2019-10-06 ENCOUNTER — Telehealth (INDEPENDENT_AMBULATORY_CARE_PROVIDER_SITE_OTHER): Payer: Self-pay | Admitting: Family Medicine

## 2019-10-06 ENCOUNTER — Other Ambulatory Visit: Payer: Self-pay

## 2019-10-06 ENCOUNTER — Encounter: Payer: Self-pay | Admitting: Family Medicine

## 2019-10-06 VITALS — Ht 66.0 in | Wt 155.0 lb

## 2019-10-06 DIAGNOSIS — F339 Major depressive disorder, recurrent, unspecified: Secondary | ICD-10-CM

## 2019-10-06 DIAGNOSIS — G4700 Insomnia, unspecified: Secondary | ICD-10-CM

## 2019-10-06 DIAGNOSIS — F411 Generalized anxiety disorder: Secondary | ICD-10-CM

## 2019-10-06 DIAGNOSIS — F902 Attention-deficit hyperactivity disorder, combined type: Secondary | ICD-10-CM

## 2019-10-06 DIAGNOSIS — G43009 Migraine without aura, not intractable, without status migrainosus: Secondary | ICD-10-CM

## 2019-10-06 DIAGNOSIS — F410 Panic disorder [episodic paroxysmal anxiety] without agoraphobia: Secondary | ICD-10-CM

## 2019-10-06 MED ORDER — ZOLPIDEM TARTRATE 10 MG PO TABS: 10.0000 mg | ORAL_TABLET | Freq: Every evening | ORAL | 2 refills | Status: DC | PRN

## 2019-10-06 MED ORDER — AMPHETAMINE-DEXTROAMPHETAMINE 30 MG PO TABS: 30.0000 mg | ORAL_TABLET | Freq: Two times a day (BID) | ORAL | 0 refills | Status: DC

## 2019-10-06 MED ORDER — BUTALBITAL-APAP-CAFFEINE 50-325-40 MG PO TABS: 1.0000 | ORAL_TABLET | ORAL | 0 refills | Status: DC | PRN

## 2019-10-06 MED ORDER — CLONAZEPAM 0.5 MG PO TABS: 0.5000 mg | ORAL_TABLET | Freq: Every day | ORAL | 0 refills | Status: DC | PRN

## 2019-10-06 MED ORDER — VORTIOXETINE HBR 20 MG PO TABS: 20.0000 mg | ORAL_TABLET | Freq: Every day | ORAL | 1 refills | Status: DC

## 2019-10-06 MED ORDER — ATENOLOL 25 MG PO TABS: 25.0000 mg | ORAL_TABLET | Freq: Two times a day (BID) | ORAL | 1 refills | Status: DC
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
## 2019-10-08 ENCOUNTER — Other Ambulatory Visit: Payer: Self-pay | Admitting: Family Medicine

## 2019-10-08 NOTE — Telephone Encounter (Signed)
Requested medication (s) are due for refill today: no  Requested medication (s) are on the active medication list: yes  Last refill:  10/06/19  Future visit scheduled: No  Notes to clinic:   request has come from [facility] last sent to [facility] 10/06/19. I tried to contact patient to see her preference but was unsuccessful.    Requested Prescriptions  Pending Prescriptions Disp Refills   zolpidem (AMBIEN) 5 MG tablet [Pharmacy Med Name: ZOLPIDEM TARTRATE 5 MG TABLET] 30 tablet 1    Sig: TAKE 1 TABLET BY MOUTH EVERY NIGHT AS NEEDED FOR SLEEP.      Not Delegated - Psychiatry:  Anxiolytics/Hypnotics Failed - 10/08/2019  5:16 PM      Failed - This refill cannot be delegated      Failed - Urine Drug Screen completed in last 360 days.      Passed - Valid encounter within last 6 months    Recent Outpatient Visits           2 days ago Chronic recurrent major depressive disorder [facility])   [facility] [place], Danna Hefty, MD   4 months ago Chronic recurrent major depressive disorder [facility])   [facility] Alba Cory, MD   7 months ago Attention deficit hyperactivity disorder (ADHD), combined type   [facility] Alba Cory, MD   10 months ago Tachycardia   [facility] Alba Cory, MD   1 year ago Chronic recurrent major depressive disorder [facility])   [facility] Alba Cory, MD
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
## 2020-01-07 ENCOUNTER — Encounter: Payer: Self-pay | Admitting: Family Medicine

## 2020-01-07 ENCOUNTER — Ambulatory Visit (INDEPENDENT_AMBULATORY_CARE_PROVIDER_SITE_OTHER): Payer: Self-pay | Admitting: Family Medicine

## 2020-01-07 DIAGNOSIS — G43009 Migraine without aura, not intractable, without status migrainosus: Secondary | ICD-10-CM

## 2020-01-07 DIAGNOSIS — G4700 Insomnia, unspecified: Secondary | ICD-10-CM

## 2020-01-07 DIAGNOSIS — F902 Attention-deficit hyperactivity disorder, combined type: Secondary | ICD-10-CM

## 2020-01-07 DIAGNOSIS — F410 Panic disorder [episodic paroxysmal anxiety] without agoraphobia: Secondary | ICD-10-CM

## 2020-01-07 DIAGNOSIS — F411 Generalized anxiety disorder: Secondary | ICD-10-CM

## 2020-01-07 DIAGNOSIS — F339 Major depressive disorder, recurrent, unspecified: Secondary | ICD-10-CM

## 2020-01-07 MED ORDER — AMPHETAMINE-DEXTROAMPHETAMINE 30 MG PO TABS: 30.0000 mg | ORAL_TABLET | Freq: Two times a day (BID) | ORAL | 0 refills | Status: DC

## 2020-01-07 MED ORDER — ESCITALOPRAM OXALATE 10 MG PO TABS: 10.0000 mg | ORAL_TABLET | Freq: Every day | ORAL | 0 refills | Status: DC

## 2020-01-07 MED ORDER — CLONAZEPAM 0.5 MG PO TABS: 0.5000 mg | ORAL_TABLET | Freq: Every day | ORAL | 0 refills | Status: DC | PRN

## 2020-01-07 MED ORDER — ATENOLOL 25 MG PO TABS: 25.0000 mg | ORAL_TABLET | Freq: Two times a day (BID) | ORAL | 1 refills | Status: DC

## 2020-01-07 MED ORDER — ZOLPIDEM TARTRATE 10 MG PO TABS: 10.0000 mg | ORAL_TABLET | Freq: Every evening | ORAL | 2 refills | Status: DC | PRN

## 2020-01-07 MED ORDER — BUTALBITAL-APAP-CAFFEINE 50-325-40 MG PO TABS: 1.0000 | ORAL_TABLET | ORAL | 0 refills | Status: DC | PRN

## 2020-01-07 NOTE — Patient Instructions (Signed)
Preventive Care 64-37 Years Old, Female Preventive care refers to visits with your health care provider and lifestyle choices that can promote health and wellness. This includes:  A yearly physical exam. This may also be called an annual well check.  Regular dental visits and eye exams.  Immunizations.  Screening for certain conditions.  Healthy lifestyle choices, such as eating a healthy diet, getting regular exercise, not using drugs or products that contain nicotine and tobacco, and limiting alcohol use. What can I expect for my preventive care visit? Physical exam Your health care provider will check your:  Height and weight. This may be used to calculate body mass index (BMI), which tells if you are at a healthy weight.  Heart rate and blood pressure.  Skin for abnormal spots. Counseling Your health care provider may ask you questions about your:  Alcohol, tobacco, and drug use.  Emotional well-being.  Home and relationship well-being.  Sexual activity.  Eating habits.  Work and work Statistician.  Method of birth control.  Menstrual cycle.  Pregnancy history. What immunizations do I need?  Influenza (flu) vaccine  This is recommended every year. Tetanus, diphtheria, and pertussis (Tdap) vaccine  You may need a Td booster every 10 years. Varicella (chickenpox) vaccine  You may need this if you have not been vaccinated. Human papillomavirus (HPV) vaccine  If recommended by your health care provider, you may need three doses over 6 months. Measles, mumps, and rubella (MMR) vaccine  You may need at least one dose of MMR. You may also need a second dose. Meningococcal conjugate (MenACWY) vaccine  One dose is recommended if you are age 22-21 years and a first-year college student living in a residence hall, or if you have one of several medical conditions. You may also need additional booster doses. Pneumococcal conjugate (PCV13) vaccine  You may need  this if you have certain conditions and were not previously vaccinated. Pneumococcal polysaccharide (PPSV23) vaccine  You may need one or two doses if you smoke cigarettes or if you have certain conditions. Hepatitis A vaccine  You may need this if you have certain conditions or if you travel or work in places where you may be exposed to hepatitis A. Hepatitis B vaccine  You may need this if you have certain conditions or if you travel or work in places where you may be exposed to hepatitis B. Haemophilus influenzae type b (Hib) vaccine  You may need this if you have certain conditions. You may receive vaccines as individual doses or as more than one vaccine together in one shot (combination vaccines). Talk with your health care provider about the risks and benefits of combination vaccines. What tests do I need?  Blood tests  Lipid and cholesterol levels. These may be checked every 5 years starting at age 73.  Hepatitis C test.  Hepatitis B test. Screening  Diabetes screening. This is done by checking your blood sugar (glucose) after you have not eaten for a while (fasting).  Sexually transmitted disease (STD) testing.  BRCA-related cancer screening. This may be done if you have a family history of breast, ovarian, tubal, or peritoneal cancers.  Pelvic exam and Pap test. This may be done every 3 years starting at age 87. Starting at age 34, this may be done every 5 years if you have a Pap test in combination with an HPV test. Talk with your health care provider about your test results, treatment options, and if necessary, the need for more tests.  Follow these instructions at home: Eating and drinking   Eat a diet that includes fresh fruits and vegetables, whole grains, lean protein, and low-fat dairy.  Take vitamin and mineral supplements as recommended by your health care provider.  Do not drink alcohol if: ? Your health care provider tells you not to drink. ? You are  pregnant, may be pregnant, or are planning to become pregnant.  If you drink alcohol: ? Limit how much you have to 0-1 drink a day. ? Be aware of how much alcohol is in your drink. In the [place] one drink equals one 12 oz bottle of beer (355 mL), one 5 oz glass of wine (148 mL), or one 1 oz glass of hard liquor (44 mL). Lifestyle  Take daily care of your teeth and gums.  Stay active. Exercise for at least 30 minutes on 5 or more days each week.  Do not use any products that contain nicotine or tobacco, such as cigarettes, e-cigarettes, and chewing tobacco. If you need help quitting, ask your health care provider.  If you are sexually active, practice safe sex. Use a condom or other form of birth control (contraception) in order to prevent pregnancy and STIs (sexually transmitted infections). If you plan to become pregnant, see your health care provider for a preconception visit. What's next?  Visit your health care provider once a year for a well check visit.  Ask your health care provider how often you should have your eyes and teeth checked.  Stay up to date on all vaccines. This information is not intended to replace advice given to you by your health care provider. Make sure you discuss any questions you have with your health care provider. Document Revised: 01/09/2018 Document Reviewed: 01/09/2018 Elsevier Patient Education  2020 [employer].

## 2020-03-29 ENCOUNTER — Other Ambulatory Visit: Payer: Self-pay | Admitting: Family Medicine

## 2020-03-29 DIAGNOSIS — F410 Panic disorder [episodic paroxysmal anxiety] without agoraphobia: Secondary | ICD-10-CM

## 2020-03-29 DIAGNOSIS — F339 Major depressive disorder, recurrent, unspecified: Secondary | ICD-10-CM

## 2020-03-30 NOTE — Telephone Encounter (Signed)
Pt is scheduled for Tuesday

## 2020-03-30 NOTE — Telephone Encounter (Signed)
Pt is requesting to do a virtual appt for this. I explained to her that she will have to do a face to face because she has not had one since 05/26/2018. Please advise.
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
## 2020-04-05 ENCOUNTER — Ambulatory Visit: Payer: Self-pay | Admitting: Family Medicine

## 2020-04-06 ENCOUNTER — Other Ambulatory Visit: Payer: Self-pay

## 2020-04-06 ENCOUNTER — Ambulatory Visit (INDEPENDENT_AMBULATORY_CARE_PROVIDER_SITE_OTHER): Payer: Self-pay | Admitting: Family Medicine

## 2020-04-06 ENCOUNTER — Encounter: Payer: Self-pay | Admitting: Family Medicine

## 2020-04-06 DIAGNOSIS — F902 Attention-deficit hyperactivity disorder, combined type: Secondary | ICD-10-CM

## 2020-04-06 DIAGNOSIS — G43009 Migraine without aura, not intractable, without status migrainosus: Secondary | ICD-10-CM

## 2020-04-06 DIAGNOSIS — G4700 Insomnia, unspecified: Secondary | ICD-10-CM

## 2020-04-06 DIAGNOSIS — F410 Panic disorder [episodic paroxysmal anxiety] without agoraphobia: Secondary | ICD-10-CM

## 2020-04-06 DIAGNOSIS — F411 Generalized anxiety disorder: Secondary | ICD-10-CM

## 2020-04-06 DIAGNOSIS — F339 Major depressive disorder, recurrent, unspecified: Secondary | ICD-10-CM

## 2020-04-06 MED ORDER — ZOLPIDEM TARTRATE 10 MG PO TABS: 10.0000 mg | ORAL_TABLET | Freq: Every evening | ORAL | 2 refills | Status: DC | PRN

## 2020-04-06 MED ORDER — AMPHETAMINE-DEXTROAMPHETAMINE 30 MG PO TABS: 30.0000 mg | ORAL_TABLET | Freq: Two times a day (BID) | ORAL | 0 refills | Status: DC

## 2020-04-06 MED ORDER — BUTALBITAL-APAP-CAFFEINE 50-325-40 MG PO TABS: 1.0000 | ORAL_TABLET | ORAL | 0 refills | Status: DC | PRN

## 2020-04-06 MED ORDER — BUPROPION HCL ER (SR) 150 MG PO TB12: 150.0000 mg | ORAL_TABLET | Freq: Two times a day (BID) | ORAL | 0 refills | Status: DC

## 2020-04-06 MED ORDER — CLONAZEPAM 0.5 MG PO TABS: 0.5000 mg | ORAL_TABLET | Freq: Every day | ORAL | 0 refills | Status: DC | PRN

## 2020-04-06 MED ORDER — ESCITALOPRAM OXALATE 10 MG PO TABS: 10.0000 mg | ORAL_TABLET | Freq: Every day | ORAL | 0 refills | Status: DC
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
## 2020-04-09 ENCOUNTER — Other Ambulatory Visit: Payer: Self-pay | Admitting: Family Medicine

## 2020-04-09 DIAGNOSIS — F339 Major depressive disorder, recurrent, unspecified: Secondary | ICD-10-CM

## 2020-04-09 DIAGNOSIS — F411 Generalized anxiety disorder: Secondary | ICD-10-CM

## 2020-06-03 ENCOUNTER — Other Ambulatory Visit: Payer: Self-pay | Admitting: Family Medicine

## 2020-06-03 DIAGNOSIS — F339 Major depressive disorder, recurrent, unspecified: Secondary | ICD-10-CM

## 2020-06-03 DIAGNOSIS — F411 Generalized anxiety disorder: Secondary | ICD-10-CM
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
## 2020-07-07 ENCOUNTER — Other Ambulatory Visit: Payer: Self-pay | Admitting: Family Medicine

## 2020-07-07 DIAGNOSIS — F339 Major depressive disorder, recurrent, unspecified: Secondary | ICD-10-CM

## 2020-07-07 DIAGNOSIS — F411 Generalized anxiety disorder: Secondary | ICD-10-CM

## 2020-07-07 NOTE — Telephone Encounter (Signed)
Requested medication (s) are due for refill today:   Yes  Requested medication (s) are on the active medication list:   Yes  Future visit scheduled:   Yes tomorrow 2/25 with Dr. Carlynn Purl at 1:40.   Last ordered: 06/03/2020 #60, 0 refills  Clinic note:  Returned because pharmacy requesting a 90 day supply and a DX Code.   Requested Prescriptions  Pending Prescriptions Disp Refills   buPROPion (WELLBUTRIN SR) 150 MG 12 hr tablet [Pharmacy Med Name: BUPROPION HCL SR 150 MG TABLET] 180 tablet 1    Sig: TAKE 1 TABLET (150 MG TOTAL) BY MOUTH 2 (TWO) TIMES DAILY. SECOND DOSE AROUND 2 PM      Psychiatry: Antidepressants - bupropion Passed - 07/07/2020 10:31 AM      Passed - Completed PHQ-2 or PHQ-9 in the last 360 days      Passed - Last BP in normal range    BP Readings from Last 1 Encounters:  04/06/20 112/70          Passed - Valid encounter within last 6 months    Recent Outpatient Visits           3 months ago Attention deficit hyperactivity disorder (ADHD), combined type   [facility] Alba Cory, MD   6 months ago Attention deficit hyperactivity disorder (ADHD), combined type   [facility] Alba Cory, MD   9 months ago Chronic recurrent major depressive disorder [facility])   [facility] Alba Cory, MD   1 year ago Chronic recurrent major depressive disorder [facility])   [facility] Alba Cory, MD   1 year ago Attention deficit hyperactivity disorder (ADHD), combined type   [facility] Alba Cory, MD       Future Appointments             Tomorrow Alba Cory, MD [facility]

## 2020-07-08 ENCOUNTER — Encounter: Payer: Self-pay | Admitting: Family Medicine

## 2020-07-08 ENCOUNTER — Other Ambulatory Visit: Payer: Self-pay

## 2020-07-08 ENCOUNTER — Ambulatory Visit (INDEPENDENT_AMBULATORY_CARE_PROVIDER_SITE_OTHER): Payer: Self-pay | Admitting: Family Medicine

## 2020-07-08 VITALS — BP 116/78 | HR 105 | Temp 98.1°F | Resp 16 | Ht 66.0 in | Wt 160.0 lb

## 2020-07-08 DIAGNOSIS — F411 Generalized anxiety disorder: Secondary | ICD-10-CM

## 2020-07-08 DIAGNOSIS — G43009 Migraine without aura, not intractable, without status migrainosus: Secondary | ICD-10-CM

## 2020-07-08 DIAGNOSIS — G4700 Insomnia, unspecified: Secondary | ICD-10-CM

## 2020-07-08 DIAGNOSIS — F410 Panic disorder [episodic paroxysmal anxiety] without agoraphobia: Secondary | ICD-10-CM

## 2020-07-08 DIAGNOSIS — F339 Major depressive disorder, recurrent, unspecified: Secondary | ICD-10-CM

## 2020-07-08 DIAGNOSIS — F902 Attention-deficit hyperactivity disorder, combined type: Secondary | ICD-10-CM

## 2020-07-08 MED ORDER — AMPHETAMINE-DEXTROAMPHETAMINE 30 MG PO TABS: 30.0000 mg | ORAL_TABLET | Freq: Two times a day (BID) | ORAL | 0 refills | Status: DC

## 2020-07-08 MED ORDER — ZOLPIDEM TARTRATE 10 MG PO TABS
10.0000 mg | ORAL_TABLET | Freq: Every evening | ORAL | 1 refills | Status: DC | PRN
Start: 2020-07-08 — End: 2020-12-30

## 2020-07-08 MED ORDER — BUTALBITAL-APAP-CAFFEINE 50-325-40 MG PO TABS: 1.0000 | ORAL_TABLET | ORAL | 1 refills | Status: DC | PRN

## 2020-07-08 MED ORDER — CLONAZEPAM 0.5 MG PO TABS: 0.5000 mg | ORAL_TABLET | Freq: Every day | ORAL | 0 refills | Status: DC | PRN

## 2020-07-08 MED ORDER — ESCITALOPRAM OXALATE 10 MG PO TABS
10.0000 mg | ORAL_TABLET | Freq: Every day | ORAL | 1 refills | Status: DC
Start: 2020-07-08 — End: 2020-12-30

## 2020-07-08 MED ORDER — BUPROPION HCL ER (SR) 150 MG PO TB12: 150.0000 mg | ORAL_TABLET | Freq: Two times a day (BID) | ORAL | 1 refills | Status: DC

## 2020-07-08 MED ORDER — ATENOLOL 25 MG PO TABS: 25.0000 mg | ORAL_TABLET | Freq: Every day | ORAL | 1 refills | Status: DC
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
## 2020-10-03 ENCOUNTER — Telehealth (INDEPENDENT_AMBULATORY_CARE_PROVIDER_SITE_OTHER): Payer: Self-pay | Admitting: Family Medicine

## 2020-10-03 ENCOUNTER — Encounter: Payer: Self-pay | Admitting: Family Medicine

## 2020-10-03 DIAGNOSIS — F902 Attention-deficit hyperactivity disorder, combined type: Secondary | ICD-10-CM

## 2020-10-03 DIAGNOSIS — F339 Major depressive disorder, recurrent, unspecified: Secondary | ICD-10-CM

## 2020-10-03 DIAGNOSIS — F411 Generalized anxiety disorder: Secondary | ICD-10-CM

## 2020-10-03 MED ORDER — AMPHETAMINE-DEXTROAMPHETAMINE 30 MG PO TABS: 30.0000 mg | ORAL_TABLET | Freq: Two times a day (BID) | ORAL | 0 refills | Status: DC

## 2020-11-04 ENCOUNTER — Other Ambulatory Visit: Payer: Self-pay | Admitting: Family Medicine

## 2020-11-04 DIAGNOSIS — F410 Panic disorder [episodic paroxysmal anxiety] without agoraphobia: Secondary | ICD-10-CM

## 2020-11-04 DIAGNOSIS — G43009 Migraine without aura, not intractable, without status migrainosus: Secondary | ICD-10-CM

## 2020-11-04 DIAGNOSIS — F339 Major depressive disorder, recurrent, unspecified: Secondary | ICD-10-CM

## 2020-11-04 NOTE — Telephone Encounter (Signed)
Requested medication (s) are due for refill today: yes  Requested medication (s) are on the active medication list: yes   Last refill:  09/20/2020  Future visit scheduled: yes   Notes to clinic:  this refill cannot be delegated    Requested Prescriptions  Pending Prescriptions Disp Refills   clonazePAM (KLONOPIN) 0.5 MG tablet [Pharmacy Med Name: CLONAZEPAM 0.5 MG TABLET] 30 tablet 0    Sig: TAKE 1 TABLET BY MOUTH EVERY DAY AS NEEDED FOR ANXIETY      Not Delegated - Psychiatry:  Anxiolytics/Hypnotics Failed - 11/04/2020  3:19 PM      Failed - This refill cannot be delegated      Failed - Urine Drug Screen completed in last 360 days      Passed - Valid encounter within last 6 months    Recent Outpatient Visits           1 month ago Attention deficit hyperactivity disorder (ADHD), combined type   [facility] Al[facility], MD   3 months ago Attention deficit hyperactivity disorder (ADHD), combined type   [facility] Alba [facility], MD   7 months ago Attention deficit hyperactivity disorder (ADHD), combined type   [facility] Alba Cory, MD   10 months ago Attention deficit hyperactivity disorder (ADHD), combined type   [facility] Alba Cory, MD   1 year ago Chronic recurrent major depressive disorder [facility])   [facility] Alba Cory, MD       Future Appointments             In 1 month Alba Cory, MD [facility]               butalbital-acetaminophen-caffeine (FIORICET) [phone] MG tablet [Pharmacy Med Name: BUTALB-ACETAMIN-CAFF 50-325-40] 30 tablet 1    Sig: Take 1 tablet by mouth every 4 (four) hours as needed for headache.      Not Delegated - Analgesics:  Non-Opioid Analgesic Combinations Failed - 11/04/2020  3:19 PM      Failed - This refill cannot be delegated      Passed - Valid encounter within last 12 months     Recent Outpatient Visits           1 month ago Attention deficit hyperactivity disorder (ADHD), combined type   [facility] Alba Cory, MD   3 months ago Attention deficit hyperactivity disorder (ADHD), combined type   [facility] Alba Cory, MD   7 months ago Attention deficit hyperactivity disorder (ADHD), combined type   [facility] Alba Cory, MD   10 months ago Attention deficit hyperactivity disorder (ADHD), combined type   [facility] Alba Cory, MD   1 year ago Chronic recurrent major depressive disorder [facility])   [facility] Alba Cory, MD       Future Appointments             In 1 month Carlynn Purl, Danna Hefty, MD [facility], [facility]
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
## 2020-12-30 ENCOUNTER — Encounter: Payer: Self-pay | Admitting: Family Medicine

## 2020-12-30 ENCOUNTER — Other Ambulatory Visit: Payer: Self-pay

## 2020-12-30 ENCOUNTER — Ambulatory Visit: Payer: Self-pay | Admitting: Family Medicine

## 2020-12-30 VITALS — BP 120/70 | HR 95 | Temp 98.2°F | Resp 14 | Ht 66.0 in | Wt 153.3 lb

## 2020-12-30 DIAGNOSIS — G4700 Insomnia, unspecified: Secondary | ICD-10-CM

## 2020-12-30 DIAGNOSIS — F411 Generalized anxiety disorder: Secondary | ICD-10-CM

## 2020-12-30 DIAGNOSIS — F410 Panic disorder [episodic paroxysmal anxiety] without agoraphobia: Secondary | ICD-10-CM

## 2020-12-30 DIAGNOSIS — F902 Attention-deficit hyperactivity disorder, combined type: Secondary | ICD-10-CM

## 2020-12-30 DIAGNOSIS — G43009 Migraine without aura, not intractable, without status migrainosus: Secondary | ICD-10-CM

## 2020-12-30 DIAGNOSIS — F339 Major depressive disorder, recurrent, unspecified: Secondary | ICD-10-CM

## 2020-12-30 MED ORDER — AMPHETAMINE-DEXTROAMPHETAMINE 30 MG PO TABS: 30.0000 mg | ORAL_TABLET | Freq: Two times a day (BID) | ORAL | 0 refills | Status: DC

## 2020-12-30 MED ORDER — BUPROPION HCL ER (SR) 150 MG PO TB12: 150.0000 mg | ORAL_TABLET | Freq: Two times a day (BID) | ORAL | 1 refills | Status: DC

## 2020-12-30 MED ORDER — ATENOLOL 25 MG PO TABS: 25.0000 mg | ORAL_TABLET | Freq: Every day | ORAL | 1 refills | Status: DC

## 2020-12-30 MED ORDER — ZOLPIDEM TARTRATE 10 MG PO TABS: 10.0000 mg | ORAL_TABLET | Freq: Every evening | ORAL | 1 refills | Status: DC | PRN

## 2020-12-30 MED ORDER — ESCITALOPRAM OXALATE 5 MG PO TABS: 5.0000 mg | ORAL_TABLET | Freq: Every day | ORAL | 0 refills | Status: DC

## 2021-01-06 ENCOUNTER — Ambulatory Visit: Payer: Self-pay | Admitting: Family Medicine

## 2021-01-20 ENCOUNTER — Ambulatory Visit: Payer: Self-pay

## 2021-01-20 ENCOUNTER — Other Ambulatory Visit: Payer: Self-pay | Admitting: Family Medicine

## 2021-01-20 DIAGNOSIS — F410 Panic disorder [episodic paroxysmal anxiety] without agoraphobia: Secondary | ICD-10-CM

## 2021-01-20 DIAGNOSIS — F339 Major depressive disorder, recurrent, unspecified: Secondary | ICD-10-CM

## 2021-01-20 NOTE — Telephone Encounter (Signed)
Patient called in tears saying that she was told by the pharmacy that a request was sent for Clonazepam to the office and they have not received a response. She says she called earlier today twice to check on the refill because it's the weekend and she really needs the medication. She says she was told it's still pending and now it's after 5 pm. I advised the office is closed and it is still pending the provider approval. She is crying at this point and asks if I can call Dr. Carlynn Purl. I called Dr. Carlynn Purl on her number listed in One Note, she answers. I advised her the patient's request above and that the patient says she's having to use the Clonazepam more frequently her lately than she normally does. Dr. Carlynn Purl says to let the patient know the Rx is being sent to [facility] on [address] and to let the patient know this Rx will need to last her for 90 days. I advised the patient of the above from Dr. Carlynn Purl, patient verbalized understanding and says Thank you very much to Dr. Carlynn Purl.

## 2021-01-20 NOTE — Telephone Encounter (Signed)
Patient requesting short supply of clonazePAM (KLONOPIN) 0.5 MG tablet [Pharmacy Med Name: CLONAZEPAM 0.5 MG TABL before the weekend  until her refill comes in .

## 2021-03-17 ENCOUNTER — Other Ambulatory Visit: Payer: Self-pay | Admitting: Family Medicine

## 2021-03-17 DIAGNOSIS — F339 Major depressive disorder, recurrent, unspecified: Secondary | ICD-10-CM

## 2021-03-17 DIAGNOSIS — F411 Generalized anxiety disorder: Secondary | ICD-10-CM
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
## 2021-03-31 ENCOUNTER — Telehealth (INDEPENDENT_AMBULATORY_CARE_PROVIDER_SITE_OTHER): Payer: Self-pay | Admitting: Family Medicine

## 2021-03-31 ENCOUNTER — Encounter: Payer: Self-pay | Admitting: Family Medicine

## 2021-03-31 DIAGNOSIS — F339 Major depressive disorder, recurrent, unspecified: Secondary | ICD-10-CM

## 2021-03-31 DIAGNOSIS — F410 Panic disorder [episodic paroxysmal anxiety] without agoraphobia: Secondary | ICD-10-CM

## 2021-03-31 DIAGNOSIS — G43009 Migraine without aura, not intractable, without status migrainosus: Secondary | ICD-10-CM

## 2021-03-31 DIAGNOSIS — F902 Attention-deficit hyperactivity disorder, combined type: Secondary | ICD-10-CM

## 2021-03-31 DIAGNOSIS — F411 Generalized anxiety disorder: Secondary | ICD-10-CM

## 2021-03-31 MED ORDER — ESCITALOPRAM OXALATE 5 MG PO TABS: 5.0000 mg | ORAL_TABLET | Freq: Every day | ORAL | 0 refills | Status: DC

## 2021-03-31 MED ORDER — BUPROPION HCL ER (SR) 150 MG PO TB12: 150.0000 mg | ORAL_TABLET | Freq: Two times a day (BID) | ORAL | 0 refills | Status: DC

## 2021-03-31 MED ORDER — AMPHETAMINE-DEXTROAMPHETAMINE 30 MG PO TABS
30.0000 mg | ORAL_TABLET | Freq: Two times a day (BID) | ORAL | 0 refills | Status: DC
Start: 2021-04-29 — End: 2021-07-06

## 2021-03-31 MED ORDER — AMPHETAMINE-DEXTROAMPHETAMINE 30 MG PO TABS
30.0000 mg | ORAL_TABLET | Freq: Two times a day (BID) | ORAL | 0 refills | Status: DC
Start: 2021-03-31 — End: 2021-07-06

## 2021-03-31 MED ORDER — AMPHETAMINE-DEXTROAMPHETAMINE 30 MG PO TABS
30.0000 mg | ORAL_TABLET | Freq: Two times a day (BID) | ORAL | 0 refills | Status: DC
Start: 2021-05-29 — End: 2021-07-06

## 2021-03-31 MED ORDER — BUTALBITAL-APAP-CAFFEINE 50-325-40 MG PO TABS: 1.0000 | ORAL_TABLET | ORAL | 1 refills | Status: DC | PRN

## 2021-03-31 MED ORDER — CLONAZEPAM 0.5 MG PO TABS: ORAL_TABLET | ORAL | 0 refills | Status: DC

## 2021-04-14 ENCOUNTER — Other Ambulatory Visit: Payer: Self-pay | Admitting: Family Medicine

## 2021-04-14 DIAGNOSIS — F411 Generalized anxiety disorder: Secondary | ICD-10-CM

## 2021-04-14 DIAGNOSIS — F339 Major depressive disorder, recurrent, unspecified: Secondary | ICD-10-CM

## 2021-05-19 ENCOUNTER — Other Ambulatory Visit: Payer: Self-pay | Admitting: Family Medicine

## 2021-05-19 DIAGNOSIS — F339 Major depressive disorder, recurrent, unspecified: Secondary | ICD-10-CM

## 2021-05-19 DIAGNOSIS — F411 Generalized anxiety disorder: Secondary | ICD-10-CM

## 2021-06-02 ENCOUNTER — Other Ambulatory Visit: Payer: Self-pay | Admitting: Internal Medicine

## 2021-06-02 DIAGNOSIS — F339 Major depressive disorder, recurrent, unspecified: Secondary | ICD-10-CM

## 2021-06-02 DIAGNOSIS — F411 Generalized anxiety disorder: Secondary | ICD-10-CM

## 2021-06-02 NOTE — Telephone Encounter (Signed)
Requested Prescriptions  Pending Prescriptions Disp Refills   escitalopram (LEXAPRO) 5 MG tablet [Pharmacy Med Name: ESCITALOPRAM 5 MG TABLET] 180 tablet 2    Sig: TAKE 1-2 TABLETS (5-10 MG TOTAL) BY MOUTH DAILY.     Psychiatry:  Antidepressants - SSRI Passed - 06/02/2021  9:38 AM      Passed - Completed PHQ-2 or PHQ-9 in the last 360 days      Passed - Valid encounter within last 6 months    Recent Outpatient Visits          2 months ago Attention deficit hyperactivity disorder (ADHD), combined type   [facility] Alba Cory, MD   5 months ago Attention deficit hyperactivity disorder (ADHD), combined type   [facility] Alba Cory, MD   8 months ago Attention deficit hyperactivity disorder (ADHD), combined type   [facility] Alba Cory, MD   10 months ago Attention deficit hyperactivity disorder (ADHD), combined type   [facility] Alba Cory, MD   1 year ago Attention deficit hyperactivity disorder (ADHD), combined type   [facility] Alba Cory, MD

## 2021-07-04 ENCOUNTER — Ambulatory Visit: Payer: Self-pay | Admitting: Internal Medicine

## 2021-07-04 NOTE — Progress Notes (Incomplete)
Established Patient Office Visit  Subjective:  Patient ID: Monica Bonilla, female    DOB: 1981-04-11  Age: 41 y.o. MRN: [id number]  CC: No chief complaint on file.   HPI Monica Bonilla presents for follow up on chronic medical conditions.   MDD:  -Mood status: {Blank single:19197::"controlled","uncontrolled","better","worse","exacerbated","stable"} -Current treatment: Lexapro 5, Wellbutrin 150 BID -Satisfied with current treatment?: {Blank single:19197::"yes","no"} -Symptom severity: {Blank single:19197::"mild","moderate","severe"}  -Duration of current treatment : {Blank single:19197::"chronic","months","years"} -Side effects: {Blank single:19197::"yes","no"} Medication compliance: {Blank single:19197::"excellent compliance","good compliance","fair compliance","poor compliance"} Psychotherapy/counseling: {Blank single:19197::"yes","no"} {Blank single:19197::"current","in the past"} Previous psychiatric medications: {Blank multiple:19196::"abilify","amitryptiline","buspar","celexa","cymbalta","depakote","effexor","lamictal","lexapro","lithium","nortryptiline","paxil","prozac","pristiq (desvenlafaxine","seroquel","wellbutrin","zoloft","zyprexa"} Depressed mood: {Blank single:19197::"yes","no"} Anxious mood: {Blank single:19197::"yes","no"} Anhedonia: {Blank single:19197::"yes","no"} Significant weight loss or gain: {Blank single:19197::"yes","no"} Insomnia: {Blank single:19197::"yes","no"} {Blank single:19197::"hard to fall asleep","hard to stay asleep"} Fatigue: {Blank single:19197::"yes","no"} Feelings of worthlessness or guilt: {Blank single:19197::"yes","no"} Impaired concentration/indecisiveness: {Blank single:19197::"yes","no"} Suicidal ideations: {Blank single:19197::"yes","no"} Hopelessness: {Blank single:19197::"yes","no"} Crying spells: {Blank single:19197::"yes","no"} Depression screen [facility] 2/9 03/31/2021 12/30/2020 10/03/2020 10/03/2020 07/08/2020  Decreased Interest 0 0 0  0 1  Down, Depressed, Hopeless 0 0 0 0 0  PHQ - 2 Score 0 0 0 0 1  Altered sleeping 0 0 0 - 2  Tired, decreased energy 0 0 1 - 1  Change in appetite 0 0 0 - 2  Feeling bad or failure about yourself  0 0 0 - 0  Trouble concentrating 0 0 0 - 0  Moving slowly or fidgety/restless 0 0 0 - 0  Suicidal thoughts 0 0 0 - 0  PHQ-9 Score 0 0 1 - 6  Difficult doing work/chores - Not difficult at all Not difficult at all - -  Some recent data might be hidden     Past Medical History:  Diagnosis Date   ADD (attention deficit disorder)    Anemia    Anxiety    Depression    ?bipolar   Insomnia    Migraine    Tobacco use     Past Surgical History:  Procedure Laterality Date   FRACTURE SURGERY  2013   collar bone    Family History  Problem Relation Age of Onset   Hypertension Mother    Hyperthyroidism Father        died from [place]   Diabetes Father    Hypertension Maternal Grandfather    Diabetes Maternal Grandfather    Heart attack Paternal Grandfather     Social History   Socioeconomic History   Marital status: Single    Spouse name: Not on file   Number of children: 1   Years of education: Not on file   Highest education level: Not on file  Occupational History   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  Tobacco Use   Smoking status: Former    Years: 1.00    Types: E-cigarettes, Cigarettes    Start date: 11/11/2013    Quit date: 05/14/2014    Years since quitting: 7.1   Smokeless tobacco: Never  Vaping Use   Vaping Use: Former   Start date: 05/14/2013   Quit date: 05/15/2015  Substance and Sexual Activity   Alcohol use: Yes    Alcohol/week: 0.0 standard drinks    Comment: rarely   Drug use: No   Sexual activity: Not Currently    Partners: Male  Other Topics Concern   Not on file  Social History Narrative   Raising her son, single parent   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 60 tablet 0   amphetamine-dextroamphetamine (ADDERALL) 30 MG tablet Take 1 tablet by mouth 2 (two) times daily. 60 tablet 0   amphetamine-dextroamphetamine (ADDERALL) 30 MG tablet Take 1 tablet by mouth 2 (two) times daily. 60 tablet 0   atenolol (TENORMIN) 25 MG tablet Take 1 tablet (25 mg total) by mouth daily. 90 tablet 1   buPROPion (WELLBUTRIN SR) 150 MG 12 hr tablet Take 1 tablet (150 mg total) by mouth 2 (two) times daily. Second dose around 2 pm 180 tablet 0   butalbital-acetaminophen-caffeine (FIORICET) 50-325-40 MG tablet Take 1 tablet by mouth every 4 (four) hours as needed for headache. 30 tablet 1   clonazePAM (KLONOPIN) 0.5 MG tablet TAKE 1 TABLET BY MOUTH EVERY DAY AS NEEDED FOR ANXIETY 30 tablet 0   escitalopram (LEXAPRO) 5 MG tablet TAKE 1-2 TABLETS (5-10 MG TOTAL) BY MOUTH DAILY. 180 tablet 2   zolpidem (AMBIEN) 10 MG tablet Take 1 tablet (10 mg total) by mouth at bedtime as needed for sleep. 90 tablet 1   No facility-administered medications prior to visit.    Allergies  Allergen Reactions   Buspar [Buspirone]     anxiety   Venlafaxine     agitation, tachycardia    ROS Review of Systems    Objective:    Physical Exam  There were no vitals taken for this visit. Wt Readings from Last 3 Encounters:  12/30/20 153 lb 4.8 oz (69.5 kg)  10/03/20 160 lb (72.6 kg)  07/08/20 160 lb (72.6 kg)     Health Maintenance Due  Topic Date Due   COVID-19 Vaccine (1) Never done   Hepatitis C Screening  Never done    TETANUS/TDAP  05/15/2019   PAP SMEAR-Modifier  02/22/2020    There are no preventive care reminders to display for this patient.  Lab Results  Component Value Date   TSH 2.26 12/26/2018   Lab Results  Component Value Date   WBC 5.1 12/26/2018   HGB 12.9 12/26/2018   HCT 38.4 12/26/2018   MCV 89.3 12/26/2018   PLT 343 12/26/2018   Lab Results  Component Value Date   NA 138 12/26/2018   K 4.1 12/26/2018   CO2 25 12/26/2018   GLUCOSE 107 (H) 12/26/2018   BUN 9 12/26/2018   CREATININE 0.82 12/26/2018   BILITOT 0.2 12/26/2018   ALKPHOS 46 09/07/2016   AST 10 12/26/2018   ALT 10 12/26/2018   PROT 6.6 12/26/2018   ALBUMIN 4.2 09/07/2016   CALCIUM 9.1 12/26/2018   ANIONGAP 5 (L) 08/25/2012   Lab Results  Component Value Date   CHOL 172 12/26/2018   Lab Results  Component Value Date   HDL 68 12/26/2018   Lab Results  Component Value Date   LDLCALC 90 12/26/2018   Lab Results  Component Value Date   TRIG 49 12/26/2018   Lab Results  Component Value Date   CHOLHDL 2.5 12/26/2018   Lab Results  Component Value Date   HGBA1C 5.3 12/26/2018  Assessment & Plan:   Problem List Items Addressed This Visit   None   No orders of the defined types were placed in this encounter.   Follow-up: No follow-ups on file.    Teodora Medici, DO
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
## 2021-07-06 ENCOUNTER — Encounter: Payer: Self-pay | Admitting: Family Medicine

## 2021-07-06 ENCOUNTER — Ambulatory Visit (INDEPENDENT_AMBULATORY_CARE_PROVIDER_SITE_OTHER): Payer: Self-pay | Admitting: Family Medicine

## 2021-07-06 VITALS — BP 110/70 | HR 95 | Resp 16 | Ht 66.0 in | Wt 153.0 lb

## 2021-07-06 DIAGNOSIS — G4700 Insomnia, unspecified: Secondary | ICD-10-CM

## 2021-07-06 DIAGNOSIS — G43009 Migraine without aura, not intractable, without status migrainosus: Secondary | ICD-10-CM

## 2021-07-06 DIAGNOSIS — F410 Panic disorder [episodic paroxysmal anxiety] without agoraphobia: Secondary | ICD-10-CM

## 2021-07-06 DIAGNOSIS — F411 Generalized anxiety disorder: Secondary | ICD-10-CM

## 2021-07-06 DIAGNOSIS — F339 Major depressive disorder, recurrent, unspecified: Secondary | ICD-10-CM

## 2021-07-06 DIAGNOSIS — Z23 Encounter for immunization: Secondary | ICD-10-CM

## 2021-07-06 DIAGNOSIS — F902 Attention-deficit hyperactivity disorder, combined type: Secondary | ICD-10-CM

## 2021-07-06 MED ORDER — ZOLPIDEM TARTRATE 10 MG PO TABS: 10.0000 mg | ORAL_TABLET | Freq: Every evening | ORAL | 1 refills | Status: DC | PRN

## 2021-07-06 MED ORDER — AMPHETAMINE-DEXTROAMPHETAMINE 30 MG PO TABS: 30.0000 mg | ORAL_TABLET | Freq: Two times a day (BID) | ORAL | 0 refills | Status: DC

## 2021-07-06 MED ORDER — BUTALBITAL-APAP-CAFFEINE 50-325-40 MG PO TABS: 1.0000 | ORAL_TABLET | ORAL | 1 refills | Status: DC | PRN

## 2021-07-06 MED ORDER — TETANUS-DIPHTH-ACELL PERTUSSIS 5-2-15.5 LF-MCG/0.5 IM SUSP: 0.5000 mL | Freq: Once | INTRAMUSCULAR | 0 refills | Status: AC

## 2021-07-06 MED ORDER — ATENOLOL 25 MG PO TABS: 25.0000 mg | ORAL_TABLET | Freq: Every day | ORAL | 1 refills | Status: DC

## 2021-07-06 MED ORDER — BUPROPION HCL ER (SR) 150 MG PO TB12: 150.0000 mg | ORAL_TABLET | Freq: Two times a day (BID) | ORAL | 0 refills | Status: DC

## 2021-07-06 MED ORDER — CLONAZEPAM 0.5 MG PO TABS: ORAL_TABLET | ORAL | 0 refills | Status: DC

## 2021-07-06 MED ORDER — DULOXETINE HCL 60 MG PO CPEP: 60.0000 mg | ORAL_CAPSULE | Freq: Every day | ORAL | 0 refills | Status: DC

## 2021-07-18 ENCOUNTER — Other Ambulatory Visit: Payer: Self-pay | Admitting: Family Medicine

## 2021-07-18 DIAGNOSIS — F339 Major depressive disorder, recurrent, unspecified: Secondary | ICD-10-CM

## 2021-07-18 DIAGNOSIS — F411 Generalized anxiety disorder: Secondary | ICD-10-CM

## 2021-09-11 ENCOUNTER — Telehealth: Payer: Self-pay | Admitting: Family Medicine

## 2021-09-11 ENCOUNTER — Other Ambulatory Visit: Payer: Self-pay | Admitting: Family Medicine

## 2021-09-11 DIAGNOSIS — F902 Attention-deficit hyperactivity disorder, combined type: Secondary | ICD-10-CM

## 2021-09-11 MED ORDER — AMPHETAMINE-DEXTROAMPHETAMINE 30 MG PO TABS: 30.0000 mg | ORAL_TABLET | Freq: Two times a day (BID) | ORAL | 0 refills | Status: DC

## 2021-09-11 NOTE — Telephone Encounter (Signed)
Pt is unable to get/find the  ?amphetamine-dextroamphetamine (ADDERALL) 30 MG tablet ? ?Pt has found that [facility] has plenty of the 20 mg. Would like Dr Carlynn Purl to send in a new Rx for ?Adderall ?20 mg 90 tablet ///  1 1/2 tabs twice a day ? ? ?In addition, pt would like her ?clonazePAM (KLONOPIN) 0.5 MG tablet ? ?Called in.  She said she did not get that last time. ?Pt is switching to this [facility] for both meds.  ?[facility] [address] - [place], [place] - [address] ? ?

## 2021-09-12 ENCOUNTER — Other Ambulatory Visit: Payer: Self-pay | Admitting: Family Medicine

## 2021-09-12 MED ORDER — AMPHETAMINE-DEXTROAMPHETAMINE 20 MG PO TABS: 20.0000 mg | ORAL_TABLET | Freq: Two times a day (BID) | ORAL | 0 refills | Status: DC

## 2021-09-12 NOTE — Telephone Encounter (Signed)
Pt called back in stating she indeed's want to do the 20mg s, and is fine with getting the 30 day supply. Please advise.  ?

## 2021-09-12 NOTE — Telephone Encounter (Signed)
Pt called back and stated we sent a 30 MG prescription, for medication amphetamine-dextroamphetamine (ADDERALL) but she needs 20 MG tablets / 90 tablets.  ?She stated that this is because her medication has been out of stock at all other pharmacies; however, [facility] can fill medication for 20 MG. ? ?Pt is requesting a call back to discuss with Dr. Carlynn Purl nurse.   ? ? ?

## 2021-09-28 ENCOUNTER — Ambulatory Visit: Payer: Self-pay | Admitting: Family Medicine

## 2021-09-29 ENCOUNTER — Telehealth (INDEPENDENT_AMBULATORY_CARE_PROVIDER_SITE_OTHER): Payer: Self-pay | Admitting: Family Medicine

## 2021-09-29 ENCOUNTER — Encounter: Payer: Self-pay | Admitting: Family Medicine

## 2021-09-29 VITALS — Ht 66.0 in | Wt 153.0 lb

## 2021-09-29 DIAGNOSIS — F902 Attention-deficit hyperactivity disorder, combined type: Secondary | ICD-10-CM

## 2021-09-29 DIAGNOSIS — F411 Generalized anxiety disorder: Secondary | ICD-10-CM

## 2021-09-29 DIAGNOSIS — F410 Panic disorder [episodic paroxysmal anxiety] without agoraphobia: Secondary | ICD-10-CM

## 2021-09-29 DIAGNOSIS — G43009 Migraine without aura, not intractable, without status migrainosus: Secondary | ICD-10-CM

## 2021-09-29 DIAGNOSIS — F339 Major depressive disorder, recurrent, unspecified: Secondary | ICD-10-CM

## 2021-09-29 MED ORDER — DULOXETINE HCL 60 MG PO CPEP: 60.0000 mg | ORAL_CAPSULE | Freq: Every day | ORAL | 1 refills | Status: DC

## 2021-09-29 MED ORDER — CLONAZEPAM 0.5 MG PO TABS: ORAL_TABLET | ORAL | 0 refills | Status: DC

## 2021-09-29 MED ORDER — BUTALBITAL-APAP-CAFFEINE 50-325-40 MG PO TABS: 1.0000 | ORAL_TABLET | ORAL | 1 refills | Status: DC | PRN

## 2021-09-29 MED ORDER — AMPHETAMINE-DEXTROAMPHETAMINE 30 MG PO TABS: 30.0000 mg | ORAL_TABLET | Freq: Two times a day (BID) | ORAL | 0 refills | Status: DC
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
## 2021-12-11 ENCOUNTER — Other Ambulatory Visit: Payer: Self-pay | Admitting: Family Medicine

## 2021-12-11 DIAGNOSIS — F902 Attention-deficit hyperactivity disorder, combined type: Secondary | ICD-10-CM

## 2021-12-11 MED ORDER — AMPHETAMINE-DEXTROAMPHETAMINE 30 MG PO TABS: 30.0000 mg | ORAL_TABLET | Freq: Two times a day (BID) | ORAL | 0 refills | Status: DC

## 2021-12-11 NOTE — Telephone Encounter (Signed)
Last seen 5/19 

## 2021-12-13 ENCOUNTER — Other Ambulatory Visit: Payer: Self-pay | Admitting: Family Medicine

## 2021-12-13 DIAGNOSIS — F902 Attention-deficit hyperactivity disorder, combined type: Secondary | ICD-10-CM

## 2021-12-13 NOTE — Addendum Note (Signed)
Addended by: Nena Polio on: 12/13/2021 02:06 PM   Modules accepted: Orders

## 2021-12-13 NOTE — Telephone Encounter (Unsigned)
Copied from CRM [id number]. Topic: General - Other >> Dec 13, 2021  4:53 PM Everette C wrote: Reason for CRM: Medication Refill - Medication: amphetamine-dextroamphetamine (ADDERALL) 30 MG tablet [[id number]] - the patient has 0 tablets remaining   Has the patient contacted their pharmacy? Yes.  The medication was originally submitted to the wrong pharmacy  (Agent: If no, request that the patient contact the pharmacy for the refill. If patient does not wish to contact the pharmacy document the reason why and proceed with request.) (Agent: If yes, when and what did the pharmacy advise?)  Preferred Pharmacy (with phone number or street name): [facility] [address] - [place], [place] - [address] [facility] AT [address] [place] [address] Phone: [phone] Fax: [phone] Hours: Not open 24 hours  Has the patient been seen for an appointment in the last year OR does the patient have an upcoming appointment? Yes.    Agent: Please be advised that RX refills may take up to 3 business days. We ask that you follow-up with your pharmacy.

## 2021-12-13 NOTE — Telephone Encounter (Signed)
Requested medication (s) are due for refill today: no  Requested medication (s) are on the active medication list: yes  Last refill:  2 days ago  Future visit scheduled: yes  Notes to clinic:  was sent to wrong pharmacy, not delegated. [facility]     Requested Prescriptions  Pending Prescriptions Disp Refills   amphetamine-dextroamphetamine (ADDERALL) 30 MG tablet 60 tablet 0    Sig: Take 1 tablet by mouth 2 (two) times daily.     Not Delegated - Psychiatry:  Stimulants/ADHD Failed - 12/13/2021  5:45 PM      Failed - This refill cannot be delegated      Failed - Urine Drug Screen completed in last 360 days      Passed - Last BP in normal range    BP Readings from Last 1 Encounters:  07/06/21 110/70         Passed - Last Heart Rate in normal range    Pulse Readings from Last 1 Encounters:  07/06/21 95         Passed - Valid encounter within last 6 months    Recent Outpatient Visits           2 months ago GAD (generalized anxiety disorder)   [facility] Alba Cory, MD   5 months ago Attention deficit hyperactivity disorder (ADHD), combined type   [facility] Alba Cory, MD   8 months ago Attention deficit hyperactivity disorder (ADHD), combined type   [facility] Alba Cory, MD   11 months ago Attention deficit hyperactivity disorder (ADHD), combined type   [facility] Alba Cory, MD   1 year ago Attention deficit hyperactivity disorder (ADHD), combined type   [facility] Alba Cory, MD       Future Appointments             In 4 weeks Alba Cory, MD [facility], [facility]

## 2021-12-13 NOTE — Telephone Encounter (Signed)
Requested medication (s) are due for refill today: No  Requested medication (s) are on the active medication list: Yes  Last refill:  12/11/21 # 60  Future visit scheduled: Yes  Notes to clinic:  See request.    Requested Prescriptions  Pending Prescriptions Disp Refills   amphetamine-dextroamphetamine (ADDERALL) 30 MG tablet 60 tablet 0    Sig: Take 1 tablet by mouth 2 (two) times daily.     Not Delegated - Psychiatry:  Stimulants/ADHD Failed - 12/13/2021  2:06 PM      Failed - This refill cannot be delegated      Failed - Urine Drug Screen completed in last 360 days      Passed - Last BP in normal range    BP Readings from Last 1 Encounters:  07/06/21 110/70         Passed - Last Heart Rate in normal range    Pulse Readings from Last 1 Encounters:  07/06/21 95         Passed - Valid encounter within last 6 months    Recent Outpatient Visits           2 months ago GAD (generalized anxiety disorder)   [facility] Alba Cory, MD   5 months ago Attention deficit hyperactivity disorder (ADHD), combined type   [facility] Alba Cory, MD   8 months ago Attention deficit hyperactivity disorder (ADHD), combined type   [facility] Alba Cory, MD   11 months ago Attention deficit hyperactivity disorder (ADHD), combined type   [facility] Alba Cory, MD   1 year ago Attention deficit hyperactivity disorder (ADHD), combined type   [facility] Alba Cory, MD       Future Appointments             In 4 weeks Alba Cory, MD [facility]            Refused Prescriptions Disp Refills   amphetamine-dextroamphetamine (ADDERALL) 30 MG tablet [Pharmacy Med Name: AMPHETAMINE SALTS 30MG  TAB(GEN ADDERALL)] 60 tablet 0    Sig: TAKE ONE TABLET BY MOUTH TWICE A DAY     Not Delegated - Psychiatry:  Stimulants/ADHD Failed - 12/13/2021   2:06 PM      Failed - This refill cannot be delegated      Failed - Urine Drug Screen completed in last 360 days      Passed - Last BP in normal range    BP Readings from Last 1 Encounters:  07/06/21 110/70         Passed - Last Heart Rate in normal range    Pulse Readings from Last 1 Encounters:  07/06/21 95         Passed - Valid encounter within last 6 months    Recent Outpatient Visits           2 months ago GAD (generalized anxiety disorder)   [facility], MD   5 months ago Attention deficit hyperactivity disorder (ADHD), combined type   [facility], MD   8 months ago Attention deficit hyperactivity disorder (ADHD), combined type   [facility], MD   11 months ago Attention deficit hyperactivity disorder (ADHD), combined type   [facility], MD   1 year ago Attention deficit hyperactivity disorder (ADHD), combined type   [facility]  Alba Cory, MD       Future Appointments             In 4 weeks Alba Cory, MD [facility], [facility]

## 2021-12-13 NOTE — Telephone Encounter (Signed)
Pt called [facility] because of the significant difference in price. Please advise  [facility] [address] - [place], [place] - [address]  [address] [place] [address]  Phone: [phone] Fax: [phone]

## 2021-12-14 MED ORDER — AMPHETAMINE-DEXTROAMPHETAMINE 30 MG PO TABS: 30.0000 mg | ORAL_TABLET | Freq: Two times a day (BID) | ORAL | 0 refills | Status: DC

## 2021-12-14 NOTE — Telephone Encounter (Signed)
Called and [facility].
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
## 2022-01-11 ENCOUNTER — Ambulatory Visit: Payer: Self-pay | Admitting: Family Medicine
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
## 2022-01-16 ENCOUNTER — Other Ambulatory Visit: Payer: Self-pay | Admitting: Family Medicine

## 2022-01-16 ENCOUNTER — Telehealth: Payer: Self-pay | Admitting: Family Medicine

## 2022-01-16 ENCOUNTER — Encounter: Payer: Self-pay | Admitting: Family Medicine

## 2022-01-16 ENCOUNTER — Ambulatory Visit (INDEPENDENT_AMBULATORY_CARE_PROVIDER_SITE_OTHER): Payer: Self-pay | Admitting: Family Medicine

## 2022-01-16 DIAGNOSIS — F411 Generalized anxiety disorder: Secondary | ICD-10-CM

## 2022-01-16 DIAGNOSIS — F339 Major depressive disorder, recurrent, unspecified: Secondary | ICD-10-CM

## 2022-01-16 DIAGNOSIS — F410 Panic disorder [episodic paroxysmal anxiety] without agoraphobia: Secondary | ICD-10-CM

## 2022-01-16 DIAGNOSIS — F902 Attention-deficit hyperactivity disorder, combined type: Secondary | ICD-10-CM

## 2022-01-16 DIAGNOSIS — G43009 Migraine without aura, not intractable, without status migrainosus: Secondary | ICD-10-CM

## 2022-01-16 DIAGNOSIS — F1011 Alcohol abuse, in remission: Secondary | ICD-10-CM

## 2022-01-16 DIAGNOSIS — G4700 Insomnia, unspecified: Secondary | ICD-10-CM

## 2022-01-16 MED ORDER — DULOXETINE HCL 60 MG PO CPEP: 60.0000 mg | ORAL_CAPSULE | Freq: Every day | ORAL | 1 refills | Status: DC

## 2022-01-16 MED ORDER — AMPHETAMINE-DEXTROAMPHETAMINE 30 MG PO TABS: 30.0000 mg | ORAL_TABLET | Freq: Two times a day (BID) | ORAL | 0 refills | Status: DC

## 2022-01-16 MED ORDER — LEVONORGESTREL 1.5 MG PO TABS: 1.5000 mg | ORAL_TABLET | Freq: Once | ORAL | 0 refills | Status: AC

## 2022-01-16 MED ORDER — AMPHETAMINE-DEXTROAMPHETAMINE 30 MG PO TABS: 30.0000 mg | ORAL_TABLET | Freq: Every day | ORAL | 0 refills | Status: DC

## 2022-01-16 MED ORDER — TOPIRAMATE 50 MG PO TABS: 50.0000 mg | ORAL_TABLET | Freq: Two times a day (BID) | ORAL | 0 refills | Status: DC

## 2022-01-16 MED ORDER — BUPROPION HCL ER (SR) 150 MG PO TB12: 150.0000 mg | ORAL_TABLET | Freq: Two times a day (BID) | ORAL | 1 refills | Status: DC

## 2022-01-16 MED ORDER — CLONAZEPAM 0.5 MG PO TABS: ORAL_TABLET | ORAL | 0 refills | Status: DC

## 2022-01-16 MED ORDER — ZOLPIDEM TARTRATE 10 MG PO TABS: 10.0000 mg | ORAL_TABLET | Freq: Every evening | ORAL | 1 refills | Status: DC | PRN

## 2022-01-16 MED ORDER — BUTALBITAL-APAP-CAFFEINE 50-325-40 MG PO TABS: 1.0000 | ORAL_TABLET | ORAL | 1 refills | Status: DC | PRN

## 2022-01-16 NOTE — Telephone Encounter (Unsigned)
Copied from CRM [id number]. Topic: General - Other >> Jan 16, 2022  3:51 PM Monica Bonilla wrote: Reason for CRM: The patient has been directed to contact their PCP and request a prescription for Plan B   The patient shares that the prescription is required in order for the patient to use their Good Rx card   The patient has stressed the urgency of their request due to the medications' time sensitivity

## 2022-03-08 ENCOUNTER — Other Ambulatory Visit: Payer: Self-pay | Admitting: Family Medicine

## 2022-03-08 DIAGNOSIS — F410 Panic disorder [episodic paroxysmal anxiety] without agoraphobia: Secondary | ICD-10-CM

## 2022-03-08 DIAGNOSIS — F339 Major depressive disorder, recurrent, unspecified: Secondary | ICD-10-CM
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
## 2022-04-16 ENCOUNTER — Telehealth (INDEPENDENT_AMBULATORY_CARE_PROVIDER_SITE_OTHER): Payer: Self-pay | Admitting: Family Medicine

## 2022-04-16 ENCOUNTER — Encounter: Payer: Self-pay | Admitting: Family Medicine

## 2022-04-16 VITALS — BP 116/68 | HR 89 | Resp 16 | Ht 66.0 in | Wt 152.0 lb

## 2022-04-16 DIAGNOSIS — F339 Major depressive disorder, recurrent, unspecified: Secondary | ICD-10-CM

## 2022-04-16 DIAGNOSIS — F902 Attention-deficit hyperactivity disorder, combined type: Secondary | ICD-10-CM

## 2022-04-16 DIAGNOSIS — F410 Panic disorder [episodic paroxysmal anxiety] without agoraphobia: Secondary | ICD-10-CM

## 2022-04-16 DIAGNOSIS — G43009 Migraine without aura, not intractable, without status migrainosus: Secondary | ICD-10-CM

## 2022-04-16 DIAGNOSIS — F411 Generalized anxiety disorder: Secondary | ICD-10-CM

## 2022-04-16 MED ORDER — AMPHETAMINE-DEXTROAMPHETAMINE 30 MG PO TABS: 30.0000 mg | ORAL_TABLET | Freq: Two times a day (BID) | ORAL | 0 refills | Status: DC

## 2022-04-16 MED ORDER — TOPIRAMATE 25 MG PO TABS: 25.0000 mg | ORAL_TABLET | Freq: Two times a day (BID) | ORAL | 0 refills | Status: DC

## 2022-04-16 MED ORDER — AMPHETAMINE-DEXTROAMPHETAMINE 30 MG PO TABS: 30.0000 mg | ORAL_TABLET | Freq: Every day | ORAL | 0 refills | Status: DC

## 2022-04-16 MED ORDER — CLONAZEPAM 0.5 MG PO TABS: ORAL_TABLET | ORAL | 0 refills | Status: DC

## 2022-04-16 NOTE — Patient Instructions (Signed)
Take topamax 25 mg at night only for one week Second week take 50 mg capsule every night  Third week go up to 75 mg total per night After that go to 100 mg at night   Save some of the 25 mg dose and 50 mg dose to titrate down if necessary  Call me when you need to take 100 mg dose and I can send a rx for that

## 2022-05-30 ENCOUNTER — Telehealth: Payer: Self-pay

## 2022-05-30 NOTE — Telephone Encounter (Signed)
Pharmacist called to clarify Adderall prescriptions written by Dr. Ancil Boozer on 04/16/2022. Sig confirmed to be 1 tablet BID with Dr. Ancil Boozer, pharmacy updated to reflect.

## 2022-07-07 ENCOUNTER — Other Ambulatory Visit: Payer: Self-pay | Admitting: Family Medicine

## 2022-07-07 DIAGNOSIS — G43009 Migraine without aura, not intractable, without status migrainosus: Secondary | ICD-10-CM
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
## 2022-07-23 ENCOUNTER — Telehealth (INDEPENDENT_AMBULATORY_CARE_PROVIDER_SITE_OTHER): Payer: Commercial Managed Care - PPO | Admitting: Family Medicine

## 2022-07-23 ENCOUNTER — Encounter: Payer: Self-pay | Admitting: Family Medicine

## 2022-07-23 VITALS — Ht 66.0 in | Wt 152.0 lb

## 2022-07-23 DIAGNOSIS — Z13220 Encounter for screening for lipoid disorders: Secondary | ICD-10-CM

## 2022-07-23 DIAGNOSIS — F411 Generalized anxiety disorder: Secondary | ICD-10-CM

## 2022-07-23 DIAGNOSIS — F902 Attention-deficit hyperactivity disorder, combined type: Secondary | ICD-10-CM

## 2022-07-23 DIAGNOSIS — G4700 Insomnia, unspecified: Secondary | ICD-10-CM

## 2022-07-23 DIAGNOSIS — F339 Major depressive disorder, recurrent, unspecified: Secondary | ICD-10-CM

## 2022-07-23 DIAGNOSIS — Z131 Encounter for screening for diabetes mellitus: Secondary | ICD-10-CM

## 2022-07-23 DIAGNOSIS — E538 Deficiency of other specified B group vitamins: Secondary | ICD-10-CM

## 2022-07-23 DIAGNOSIS — F1011 Alcohol abuse, in remission: Secondary | ICD-10-CM

## 2022-07-23 DIAGNOSIS — F410 Panic disorder [episodic paroxysmal anxiety] without agoraphobia: Secondary | ICD-10-CM

## 2022-07-23 DIAGNOSIS — Z79899 Other long term (current) drug therapy: Secondary | ICD-10-CM

## 2022-07-23 DIAGNOSIS — K5909 Other constipation: Secondary | ICD-10-CM

## 2022-07-23 DIAGNOSIS — G43009 Migraine without aura, not intractable, without status migrainosus: Secondary | ICD-10-CM

## 2022-07-23 MED ORDER — BUPROPION HCL ER (SR) 150 MG PO TB12: 150.0000 mg | ORAL_TABLET | Freq: Two times a day (BID) | ORAL | 1 refills | Status: DC

## 2022-07-23 MED ORDER — AMPHETAMINE-DEXTROAMPHETAMINE 30 MG PO TABS: 30.0000 mg | ORAL_TABLET | Freq: Every day | ORAL | 0 refills | Status: DC

## 2022-07-23 MED ORDER — AMPHETAMINE-DEXTROAMPHETAMINE 30 MG PO TABS: 30.0000 mg | ORAL_TABLET | Freq: Two times a day (BID) | ORAL | 0 refills | Status: DC

## 2022-07-23 MED ORDER — DULOXETINE HCL 60 MG PO CPEP: 60.0000 mg | ORAL_CAPSULE | Freq: Every day | ORAL | 1 refills | Status: DC

## 2022-07-23 MED ORDER — CLONAZEPAM 0.5 MG PO TABS: ORAL_TABLET | ORAL | 0 refills | Status: DC

## 2022-07-23 MED ORDER — ZOLPIDEM TARTRATE 10 MG PO TABS: 10.0000 mg | ORAL_TABLET | Freq: Every evening | ORAL | 1 refills | Status: DC | PRN

## 2022-07-23 MED ORDER — TOPIRAMATE 100 MG PO TABS: 100.0000 mg | ORAL_TABLET | Freq: Two times a day (BID) | ORAL | 0 refills | Status: DC

## 2022-08-08 ENCOUNTER — Emergency Department
Admission: EM | Admit: 2022-08-08 | Discharge: 2022-08-08 | Disposition: A | Discharge status: Home / Self Care | Payer: No Typology Code available for payment source | Attending: Emergency Medicine | Admitting: Emergency Medicine

## 2022-08-08 ENCOUNTER — Emergency Department: Payer: No Typology Code available for payment source

## 2022-08-08 ENCOUNTER — Other Ambulatory Visit: Payer: Self-pay

## 2022-08-08 DIAGNOSIS — S8002XA Contusion of left knee, initial encounter: Secondary | ICD-10-CM | POA: Diagnosis not present

## 2022-08-08 DIAGNOSIS — S139XXA Sprain of joints and ligaments of unspecified parts of neck, initial encounter: Secondary | ICD-10-CM | POA: Insufficient documentation

## 2022-08-08 DIAGNOSIS — S63602A Unspecified sprain of left thumb, initial encounter: Secondary | ICD-10-CM | POA: Diagnosis not present

## 2022-08-08 DIAGNOSIS — S6992XA Unspecified injury of left wrist, hand and finger(s), initial encounter: Secondary | ICD-10-CM | POA: Diagnosis present

## 2022-08-08 DIAGNOSIS — Y92410 Unspecified street and highway as the place of occurrence of the external cause: Secondary | ICD-10-CM | POA: Insufficient documentation

## 2022-08-08 MED ORDER — CYCLOBENZAPRINE HCL 10 MG PO TABS: 10.0000 mg | ORAL_TABLET | Freq: Three times a day (TID) | ORAL | 0 refills | Status: DC | PRN

## 2022-08-08 MED ORDER — OXYCODONE-ACETAMINOPHEN 5-325 MG PO TABS: 1.0000 | ORAL_TABLET | ORAL | 0 refills | Status: DC | PRN

## 2022-08-08 MED ORDER — OXYCODONE-ACETAMINOPHEN 5-325 MG PO TABS
1.0000 | ORAL_TABLET | Freq: Once | ORAL | Status: AC
  Administered 2022-08-08: 1 via ORAL
  Filled 2022-08-08: qty 1

## 2022-08-08 MED ORDER — MELOXICAM 15 MG PO TABS: 15.0000 mg | ORAL_TABLET | Freq: Every day | ORAL | 2 refills | Status: DC

## 2022-08-08 MED ORDER — CYCLOBENZAPRINE HCL 10 MG PO TABS
10.0000 mg | ORAL_TABLET | Freq: Once | ORAL | Status: AC
  Administered 2022-08-08: 10 mg via ORAL
  Filled 2022-08-08: qty 1

## 2022-08-08 NOTE — ED Notes (Signed)
Pt declined DC VS, "my vitals were good when I got here."
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
## 2022-08-08 NOTE — Discharge Instructions (Addendum)
Follow-up with your regular doctor if not improving in 5 to 7 days.  To follow-up with orthopedics.  Take medications as prescribed.

## 2022-09-06 ENCOUNTER — Other Ambulatory Visit: Payer: Self-pay | Admitting: Family Medicine

## 2022-09-06 DIAGNOSIS — G43009 Migraine without aura, not intractable, without status migrainosus: Secondary | ICD-10-CM

## 2022-09-24 ENCOUNTER — Other Ambulatory Visit: Payer: Self-pay | Admitting: Family Medicine

## 2022-09-24 ENCOUNTER — Telehealth: Payer: Self-pay | Admitting: Family Medicine

## 2022-09-24 DIAGNOSIS — F902 Attention-deficit hyperactivity disorder, combined type: Secondary | ICD-10-CM

## 2022-09-24 MED ORDER — AMPHETAMINE-DEXTROAMPHETAMINE 30 MG PO TABS: 30.0000 mg | ORAL_TABLET | Freq: Two times a day (BID) | ORAL | 0 refills | Status: DC

## 2022-09-24 NOTE — Telephone Encounter (Signed)
Update adderall Rx's on medication list. Is it ordered daily or BID? Patient called and requesting this changed to BID and requesting a refill to the pharmacy, [facility] on file.

## 2022-09-24 NOTE — Telephone Encounter (Signed)
Pt stated she contacted her pharmacy but the Rx for amphetamine-dextroamphetamine (ADDERALL) 30 MG tablet was incorrect and it will cause problems in the future if she has it filled. Pt stated the Rx is suppose to be taken twice daily but her pharmacy told her the Rx was for 1 tablet daily. Pt requests that the correct Rx for her take it twice daily be sent to [facility] [address] - [place], [place]

## 2022-11-02 ENCOUNTER — Other Ambulatory Visit: Payer: Self-pay | Admitting: Family Medicine

## 2022-11-02 DIAGNOSIS — F902 Attention-deficit hyperactivity disorder, combined type: Secondary | ICD-10-CM

## 2022-11-02 MED ORDER — AMPHETAMINE-DEXTROAMPHETAMINE 30 MG PO TABS: 30.0000 mg | ORAL_TABLET | Freq: Two times a day (BID) | ORAL | 0 refills | Status: DC

## 2022-11-02 NOTE — Telephone Encounter (Signed)
Unable to refuse 

## 2022-12-04 ENCOUNTER — Other Ambulatory Visit: Payer: Self-pay | Admitting: Family Medicine

## 2022-12-04 DIAGNOSIS — F902 Attention-deficit hyperactivity disorder, combined type: Secondary | ICD-10-CM

## 2022-12-04 NOTE — Telephone Encounter (Signed)
Medication Refill - Medication: amphetamine-dextroamphetamine (ADDERALL) 30 MG tablet   Has the patient contacted their pharmacy? No.  Preferred Pharmacy (with phone number or street name):  [facility] [address] [facility] - [place], [place] - [address] Phone: [phone]  Fax: [phone]     Has the patient been seen for an appointment in the last year OR does the patient have an upcoming appointment? No.  Agent: Please be advised that RX refills may take up to 3 business days. We ask that you follow-up with your pharmacy.  Patient stated she only has 4 days worthh of medication left

## 2022-12-05 ENCOUNTER — Other Ambulatory Visit: Payer: Self-pay

## 2022-12-05 NOTE — Telephone Encounter (Signed)
Requested medication (s) are due for refill today: yes  Requested medication (s) are on the active medication list: yes  Last refill:  09/24/22  Future visit scheduled: no  Notes to clinic:  Unable to refill per protocol, cannot delegate.      Requested Prescriptions  Pending Prescriptions Disp Refills   amphetamine-dextroamphetamine (ADDERALL) 30 MG tablet 60 tablet 0    Sig: Take 1 tablet by mouth 2 (two) times daily.     Not Delegated - Psychiatry:  Stimulants/ADHD Failed - 12/04/2022  2:57 PM      Failed - This refill cannot be delegated      Failed - Urine Drug Screen completed in last 360 days      Passed - Last BP in normal range    BP Readings from Last 1 Encounters:  08/08/22 122/80         Passed - Last Heart Rate in normal range    Pulse Readings from Last 1 Encounters:  08/08/22 73         Passed - Valid encounter within last 6 months    Recent Outpatient Visits           4 months ago Chronic recurrent major depressive disorder [facility])   [place] [facility] Alba Cory, MD   7 months ago Chronic recurrent major depressive disorder [facility])   [place] [facility] Alba Cory, MD   10 months ago History of alcohol abuse   [facility] Alba Cory, MD   1 year ago GAD (generalized anxiety disorder)   [place] [facility] Alba Cory, MD   1 year ago Attention deficit hyperactivity disorder (ADHD), combined type   [facility] Alba Cory, MD

## 2022-12-06 ENCOUNTER — Ambulatory Visit (INDEPENDENT_AMBULATORY_CARE_PROVIDER_SITE_OTHER): Payer: Self-pay | Admitting: Advanced Practice Midwife

## 2022-12-06 VITALS — BP 116/83 | HR 101 | Wt 168.1 lb

## 2022-12-06 DIAGNOSIS — Z3490 Encounter for supervision of normal pregnancy, unspecified, unspecified trimester: Secondary | ICD-10-CM

## 2022-12-06 DIAGNOSIS — Z3201 Encounter for pregnancy test, result positive: Secondary | ICD-10-CM

## 2022-12-06 DIAGNOSIS — N912 Amenorrhea, unspecified: Secondary | ICD-10-CM

## 2022-12-06 LAB — POCT URINE PREGNANCY: Preg Test, Ur: POSITIVE — AB

## 2022-12-07 ENCOUNTER — Encounter: Payer: Self-pay | Admitting: Advanced Practice Midwife

## 2022-12-07 NOTE — Progress Notes (Signed)
Patient ID: Monica Bonilla, female   DOB: 17-Feb-1981, 42 y.o.   MRN: [id number]  Reason for Visit: pregnancy confirmation   Subjective:  Date of Service: 12/06/2022  HPI:  Monica Bonilla is a 42 y.o. female accompanied by her partner is being seen for confirmation of positive home pregnancy test which she took  a week and a half ago. The test here today is positive. This was not a planned, however not unwanted, pregnancy. She reports irregular menses for the past few months- and some irregularity in the past year- so she is uncertain when her LMP was. She reports some symptoms of fatigue and nausea in June and July. She also thinks she has felt "flutters" since May and her partner reports feeling fetal movement in the upper abdomen so she is concerned she could be further along in the pregnancy. Uterus not palpable abdominally and heart tones not heard with doppler. Reviewed timing of NOB visits including dating ultrasound. I offered Beta Hcg to get some idea of gestational age- patient accepts.  Her main concern is management of her mental health meds. Currently only taking Adderall and Ambien. She stopped Wellbutrin and Cymbalta. She also has not been taking prescribed Clonazepam. With her first pregnancy she was referred to [facility] mood disorder clinic for management and was placed on Depakote for mood stability. Reviewed pregnancy categories of the various medications and the thought process of risk vs benefits in relation to pregnancy. She has been doing ok so far on current regimen and knows that we will continually evaluate and adjust as needed. Suggested she see MD for NOB visit to discuss all.   Past Medical History:  Diagnosis Date   ADD (attention deficit disorder)    Anemia    Anxiety    Depression    ?bipolar   Insomnia    Migraine    Tobacco use    Family History  Problem Relation Age of Onset   Hypertension Mother    Hyperthyroidism Father        died from MVA   Diabetes  Father    Hypertension Maternal Grandfather    Diabetes Maternal Grandfather    Heart attack Paternal Grandfather    Past Surgical History:  Procedure Laterality Date   FRACTURE SURGERY  2013   collar bone    Short Social History:  Social History   Tobacco Use   Smoking status: Former    Current packs/day: 0.00    Types: E-cigarettes, Cigarettes    Start date: 11/11/2013    Quit date: 05/14/2014    Years since quitting: 8.5   Smokeless tobacco: Never  Substance Use Topics   Alcohol use: Yes    Alcohol/week: 0.0 standard drinks of alcohol    Comment: rarely    Allergies  Allergen Reactions   Buspar [Buspirone]     anxiety   Venlafaxine     agitation, tachycardia    Current Outpatient Medications  Medication Sig Dispense Refill   amphetamine-dextroamphetamine (ADDERALL) 30 MG tablet Take 1 tablet by mouth daily. 60 tablet 0   amphetamine-dextroamphetamine (ADDERALL) 30 MG tablet Take 1 tablet by mouth 2 (two) times daily. 60 tablet 0   zolpidem (AMBIEN) 10 MG tablet Take 1 tablet (10 mg total) by mouth at bedtime as needed for sleep. 90 tablet 1   amphetamine-dextroamphetamine (ADDERALL) 30 MG tablet Take 1 tablet by mouth 2 (two) times daily. 60 tablet 0   atenolol (TENORMIN) 25 MG tablet Take 1 tablet (25 mg  total) by mouth daily. (Patient not taking: Reported on 12/06/2022) 90 tablet 1   buPROPion (WELLBUTRIN SR) 150 MG 12 hr tablet Take 1 tablet (150 mg total) by mouth 2 (two) times daily. Second dose around 2 pm (Patient not taking: Reported on 12/06/2022) 180 tablet 1   butalbital-acetaminophen-caffeine (FIORICET) 50-325-40 MG tablet TAKE ONE TABLET BY MOUTH EVERY 4 HOURS AS NEEDED FOR HEADACHE (Patient not taking: Reported on 12/06/2022) 30 tablet 0   clonazePAM (KLONOPIN) 0.5 MG tablet TAKE 1 TABLET BY MOUTH EVERY DAY AS NEEDED FOR ANXIETY (Patient not taking: Reported on 12/06/2022) 45 tablet 0   cyclobenzaprine (FLEXERIL) 10 MG tablet Take 1 tablet (10 mg total) by mouth  3 (three) times daily as needed. (Patient not taking: Reported on 12/06/2022) 30 tablet 0   DULoxetine (CYMBALTA) 60 MG capsule Take 1 capsule (60 mg total) by mouth daily. (Patient not taking: Reported on 12/06/2022) 90 capsule 1   meloxicam (MOBIC) 15 MG tablet Take 1 tablet (15 mg total) by mouth daily. (Patient not taking: Reported on 12/06/2022) 30 tablet 2   oxyCODONE-acetaminophen (PERCOCET) 5-325 MG tablet Take 1 tablet by mouth every 4 (four) hours as needed for severe pain. (Patient not taking: Reported on 12/06/2022) 10 tablet 0   topiramate (TOPAMAX) 100 MG tablet Take 1 tablet (100 mg total) by mouth 2 (two) times daily. (Patient not taking: Reported on 12/06/2022) 180 tablet 0   No current facility-administered medications for this visit.    Review of Systems  Constitutional:  Positive for malaise/fatigue. Negative for chills and fever.  HENT:  Negative for congestion, ear discharge, ear pain, hearing loss, sinus pain and sore throat.   Eyes:  Negative for blurred vision and double vision.  Respiratory:  Negative for cough, shortness of breath and wheezing.   Cardiovascular:  Negative for chest pain, palpitations and leg swelling.  Gastrointestinal:  Positive for nausea. Negative for abdominal pain, blood in stool, constipation, diarrhea, heartburn, melena and vomiting.  Genitourinary:  Negative for dysuria, flank pain, frequency, hematuria and urgency.  Musculoskeletal:  Negative for back pain, joint pain and myalgias.  Skin:  Negative for itching and rash.  Neurological:  Negative for dizziness, tingling, tremors, sensory change, speech change, focal weakness, seizures, loss of consciousness, weakness and headaches.  Endo/Heme/Allergies:  Negative for environmental allergies. Does not bruise/bleed easily.  Psychiatric/Behavioral:  Positive for depression. Negative for hallucinations, memory loss, substance abuse and suicidal ideas. The patient is not nervous/anxious and does not have  insomnia.        Positive for anxiety        Objective:  Objective   Vitals:   12/06/22 1431  BP: 116/83  Pulse: (!) 101  Weight: 168 lb 1.6 oz (76.2 kg)   Body mass index is 27.13 kg/m. Constitutional: Well nourished, well developed female in no acute distress.  HEENT: normal Skin: Warm and dry.  Cardiovascular: Regular rate and rhythm.   Respiratory:  Normal respiratory effort Psych: Alert and Oriented x3. No memory deficits. Normal mood and affect.    Data:  Latest Reference Range & Units 12/06/22 14:32  Preg Test, Ur Negative  Positive !  !: Data is abnormal     Assessment/Plan:     42 y.o. G2 P42 female with positive UPT  Beta Hcg today Follow up with NOB visits MD visit for initial NOB to discuss mental health medications   Tresea Mall, CNM [facility] 12/07/2022 9:35 AM

## 2022-12-11 ENCOUNTER — Ambulatory Visit: Payer: Self-pay | Admitting: *Deleted

## 2022-12-11 ENCOUNTER — Other Ambulatory Visit: Payer: Self-pay | Admitting: Family Medicine

## 2022-12-11 DIAGNOSIS — F902 Attention-deficit hyperactivity disorder, combined type: Secondary | ICD-10-CM

## 2022-12-11 NOTE — Telephone Encounter (Signed)
Pt has scheduled the next available appt to see PCP, seeking refill   Medication Refill - Medication: amphetamine-dextroamphetamine (ADDERALL) 30 MG tablet   Has the patient contacted their pharmacy? Yes.   (Agent: If no, request that the patient contact the pharmacy for the refill. If patient does not wish to contact the pharmacy document the reason why and proceed with request.) (Agent: If yes, when and what did the pharmacy advise?)  Preferred Pharmacy (with phone number or street name):  [facility] [address] - [place], [place] - [address]  [address] [place] [address]  Phone: [phone] Fax: [phone]   Has the patient been seen for an appointment in the last year OR does the patient have an upcoming appointment? Yes.    Agent: Please be advised that RX refills may take up to 3 business days. We ask that you follow-up with your pharmacy.

## 2022-12-11 NOTE — Telephone Encounter (Signed)
  Chief Complaint: Vaginal Itching Symptoms: Vaginal itching, burning Frequency: 1 week Pertinent Negatives: Patient denies discharge, antibiotics Disposition: [] ED /[] Urgent Care (no appt availability in office) / [] Appointment(In office/virtual)/ []  [facility]/ [] Home Care/ [] Refused Recommended Disposition /[] [facility]/ [x]  Follow-up with PCP Additional Notes: Reports has been swimming in river a lot. "Feels like when I have yeast infection." Has not tried any OTC products. Pt is pregnant. Pt has appt for med refills Friday, requesting med for yeast infection be called in prior, "Getting worse." Please advise. Reason for Disposition  [1] Symptoms of a yeast infection (i.e., itchy, white discharge, not bad smelling) AND [2] not improved > 3 days following Care Advice  Answer Assessment - Initial Assessment Questions 1. SYMPTOM: "What's the main symptom you're concerned about?" (e.g., pain, itching, dryness)     Itching, burning 2. LOCATION: "Where is the   located?" (e.g., inside/outside, left/right)     Vaginal itching 3. ONSET: "When did the  start?"     Last week 4. PAIN: "Is there any pain?" If Yes, ask: "How bad is it?" (Scale: 1-10; mild, moderate, severe)   -  MILD (1-3): Doesn't interfere with normal activities.    -  MODERATE (4-7): Interferes with normal activities (e.g., work or school) or awakens from sleep.     -  SEVERE (8-10): Excruciating pain, unable to do any normal activities.     Burning 5. ITCHING: "Is there any itching?" If Yes, ask: "How bad is it?" (Scale: 1-10; mild, moderate, severe)     Severe. 6. CAUSE: "What do you think is causing the discharge?" "Have you had the same problem before? What happened then?"     No discharge 7. OTHER SYMPTOMS: "Do you have any other symptoms?" (e.g., fever, itching, vaginal bleeding, pain with urination, injury to genital area, vaginal foreign body)     No  Protocols used: Vaginal Symptoms-A-AH

## 2022-12-12 ENCOUNTER — Encounter: Payer: Self-pay | Admitting: Family Medicine

## 2022-12-12 NOTE — Telephone Encounter (Signed)
Pt will ask the pharmacist which otc is good to use. Pt has an appt on Friday 12/14/22 and has 1 adderell left and is asking if one refill can be called in until her appt on Friday?
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
## 2022-12-12 NOTE — Telephone Encounter (Signed)
Requested medication (s) are due for refill today: yes  Requested medication (s) are on the active medication list: yes  Last refill:    Future visit scheduled: yes  Notes to clinic:  Unable to refill per protocol, cannot delegate.      Requested Prescriptions  Pending Prescriptions Disp Refills   amphetamine-dextroamphetamine (ADDERALL) 30 MG tablet 60 tablet 0    Sig: Take 1 tablet by mouth daily.     Not Delegated - Psychiatry:  Stimulants/ADHD Failed - 12/11/2022  2:37 PM      Failed - This refill cannot be delegated      Failed - Urine Drug Screen completed in last 360 days      Passed - Last BP in normal range    BP Readings from Last 1 Encounters:  12/06/22 116/83         Passed - Last Heart Rate in normal range    Pulse Readings from Last 1 Encounters:  12/06/22 (!) 101         Passed - Valid encounter within last 6 months    Recent Outpatient Visits           4 months ago Chronic recurrent major depressive disorder [facility])   [place] [facility], Danna Hefty, MD   8 months ago Chronic recurrent major depressive disorder [facility])   [place] [facility] Alba Cory, MD   11 months ago History of alcohol abuse   [facility] Alba Cory, MD   1 year ago GAD (generalized anxiety disorder)   [place] [facility] Alba Cory, MD   1 year ago Attention deficit hyperactivity disorder (ADHD), combined type   [facility] Alba Cory, MD       Future Appointments             In 2 days Alba Cory, MD [facility], [facility]

## 2022-12-12 NOTE — Telephone Encounter (Signed)
Called and spoke to Monica Bonilla and asked her to read her MyChart message that Dr Carlynn Purl sent to her and go from there. Monica Bonilla agreed to that plan.

## 2022-12-14 ENCOUNTER — Other Ambulatory Visit: Payer: Self-pay | Admitting: Advanced Practice Midwife

## 2022-12-14 ENCOUNTER — Ambulatory Visit
Admission: RE | Admit: 2022-12-14 | Discharge: 2022-12-14 | Disposition: A | Discharge status: Home / Self Care | Payer: Self-pay | Source: Ambulatory Visit | Attending: Advanced Practice Midwife | Admitting: Advanced Practice Midwife

## 2022-12-14 ENCOUNTER — Ambulatory Visit: Payer: Self-pay | Admitting: Family Medicine

## 2022-12-14 DIAGNOSIS — Z3687 Encounter for antenatal screening for uncertain dates: Secondary | ICD-10-CM | POA: Insufficient documentation

## 2022-12-14 DIAGNOSIS — Z3689 Encounter for other specified antenatal screening: Secondary | ICD-10-CM | POA: Insufficient documentation

## 2022-12-14 DIAGNOSIS — Z3490 Encounter for supervision of normal pregnancy, unspecified, unspecified trimester: Secondary | ICD-10-CM

## 2022-12-14 DIAGNOSIS — Z3A01 Less than 8 weeks gestation of pregnancy: Secondary | ICD-10-CM | POA: Insufficient documentation

## 2022-12-14 DIAGNOSIS — N912 Amenorrhea, unspecified: Secondary | ICD-10-CM

## 2022-12-20 ENCOUNTER — Ambulatory Visit: Payer: Self-pay

## 2022-12-26 ENCOUNTER — Ambulatory Visit (INDEPENDENT_AMBULATORY_CARE_PROVIDER_SITE_OTHER): Payer: Self-pay

## 2022-12-26 ENCOUNTER — Telehealth: Payer: Self-pay

## 2022-12-26 DIAGNOSIS — F902 Attention-deficit hyperactivity disorder, combined type: Secondary | ICD-10-CM

## 2022-12-26 DIAGNOSIS — Z9151 Personal history of suicidal behavior: Secondary | ICD-10-CM

## 2022-12-26 DIAGNOSIS — F411 Generalized anxiety disorder: Secondary | ICD-10-CM

## 2022-12-26 DIAGNOSIS — F339 Major depressive disorder, recurrent, unspecified: Secondary | ICD-10-CM

## 2022-12-26 DIAGNOSIS — Z3480 Encounter for supervision of other normal pregnancy, unspecified trimester: Secondary | ICD-10-CM | POA: Insufficient documentation

## 2022-12-26 DIAGNOSIS — F1011 Alcohol abuse, in remission: Secondary | ICD-10-CM

## 2022-12-26 DIAGNOSIS — Z3689 Encounter for other specified antenatal screening: Secondary | ICD-10-CM

## 2022-12-26 DIAGNOSIS — F4310 Post-traumatic stress disorder, unspecified: Secondary | ICD-10-CM
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
## 2022-12-26 NOTE — Telephone Encounter (Signed)
TRIAGE VOICEMAIL: Patient has questions concerning antidepressants and adderall. Pt states she is really struggling because she is off of her medications; here PCP won't refill them b/c she is preg. She has not been able to function. Inquiring if OB providers will be able to treat this or if she will need a referral to [facility] where she was treated her last pregnancy.

## 2022-12-26 NOTE — Patient Instructions (Signed)
First Trimester of Pregnancy  The first trimester of pregnancy starts on the first day of your last menstrual period until the end of week 12. This is also called months 1 through 3 of pregnancy. Body changes during your first trimester Your body goes through many changes during pregnancy. The changes usually return to normal after your baby is born. Physical changes You may gain or lose weight. Your breasts may grow larger and hurt. The area around your nipples may get darker. Dark spots or blotches may develop on your face. You may have changes in your hair. Health changes You may feel like you might vomit (nauseous), and you may vomit. You may have heartburn. You may have headaches. You may have trouble pooping (constipation). Your gums may bleed. Other changes You may get tired easily. You may pee (urinate) more often. Your menstrual periods will stop. You may not feel hungry. You may want to eat certain kinds of food. You may have changes in your emotions from day to day. You may have more dreams. Follow these instructions at home: Medicines Take over-the-counter and prescription medicines only as told by your doctor. Some medicines are not safe during pregnancy. Take a prenatal vitamin that contains at least 600 micrograms (mcg) of folic acid. Eating and drinking Eat healthy meals that include: Fresh fruits and vegetables. Whole grains. Good sources of protein, such as meat, eggs, or tofu. Low-fat dairy products. Avoid raw meat and unpasteurized juice, milk, and cheese. If you feel like you may vomit, or you vomit: Eat 4 or 5 small meals a day instead of 3 large meals. Try eating a few soda crackers. Drink liquids between meals instead of during meals. You may need to take these actions to prevent or treat trouble pooping: Drink enough fluids to keep your pee (urine) pale yellow. Eat foods that are high in fiber. These include beans, whole grains, and fresh fruits and  vegetables. Limit foods that are high in fat and sugar. These include fried or sweet foods. Activity Exercise only as told by your doctor. Most people can do their usual exercise routine during pregnancy. Stop exercising if you have cramps or pain in your lower belly (abdomen) or low back. Do not exercise if it is too hot or too humid, or if you are in a place of great height (high altitude). Avoid heavy lifting. If you choose to, you may have sex unless your doctor tells you not to. Relieving pain and discomfort Wear a good support bra if your breasts are sore. Rest with your legs raised (elevated) if you have leg cramps or low back pain. If you have bulging veins (varicose veins) in your legs: Wear support hose as told by your doctor. Raise your feet for 15 minutes, 3-4 times a day. Limit salt in your food. Safety Wear your seat belt at all times when you are in a car. Talk with your doctor if someone is hurting you or yelling at you. Talk with your doctor if you are feeling sad or have thoughts of hurting yourself. Lifestyle Do not use hot tubs, steam rooms, or saunas. Do not douche. Do not use tampons or scented sanitary pads. Do not use herbal medicines, illegal drugs, or medicines that are not approved by your doctor. Do not drink alcohol. Do not smoke or use any products that contain nicotine or tobacco. If you need help quitting, ask your doctor. Avoid cat litter boxes and soil that is used by cats. These carry   germs that can cause harm to the baby and can cause a loss of your baby by miscarriage or stillbirth. General instructions Keep all follow-up visits. This is important. Ask for help if you need counseling or if you need help with nutrition. Your doctor can give you advice or tell you where to go for help. Visit your dentist. At home, brush your teeth with a soft toothbrush. Floss gently. Write down your questions. Take them to your prenatal visits. Where to find more  information American Pregnancy Association: americanpregnancy.org American College of Obstetricians and Gynecologists: www.acog.org Office on Women's Health: womenshealth.gov/pregnancy Contact a doctor if: You are dizzy. You have a fever. You have mild cramps or pressure in your lower belly. You have a nagging pain in your belly area. You continue to feel like you may vomit, you vomit, or you have watery poop (diarrhea) for 24 hours or longer. You have a bad-smelling fluid coming from your vagina. You have pain when you pee. You are exposed to a disease that spreads from person to person, such as chickenpox, measles, Zika virus, HIV, or hepatitis. Get help right away if: You have spotting or bleeding from your vagina. You have very bad belly cramping or pain. You have shortness of breath or chest pain. You have any kind of injury, such as from a fall or a car crash. You have new or increased pain, swelling, or redness in an arm or leg. Summary The first trimester of pregnancy starts on the first day of your last menstrual period until the end of week 12 (months 1 through 3). Eat 4 or 5 small meals a day instead of 3 large meals. Do not smoke or use any products that contain nicotine or tobacco. If you need help quitting, ask your doctor. Keep all follow-up visits. This information is not intended to replace advice given to you by your health care provider. Make sure you discuss any questions you have with your health care provider. Document Revised: 10/07/2019 Document Reviewed: 08/13/2019 Elsevier Patient Education  2024 Elsevier Inc. Commonly Asked Questions During Pregnancy  Cats: A parasite can be excreted in cat feces.  To avoid exposure you need to have another person empty the little box.  If you must empty the litter box you will need to wear gloves.  Wash your hands after handling your cat.  This parasite can also be found in raw or undercooked meat so this should also be  avoided.  Colds, Sore Throats, Flu: Please check your medication sheet to see what you can take for symptoms.  If your symptoms are unrelieved by these medications please call the office.  Dental Work: Most any dental work your dentist recommends is permitted.  X-rays should only be taken during the first trimester if absolutely necessary.  Your abdomen should be shielded with a lead apron during all x-rays.  Please notify your provider prior to receiving any x-rays.  Novocaine is fine; gas is not recommended.  If your dentist requires a note from us prior to dental work please call the office and we will provide one for you.  Exercise: Exercise is an important part of staying healthy during your pregnancy.  You may continue most exercises you were accustomed to prior to pregnancy.  Later in your pregnancy you will most likely notice you have difficulty with activities requiring balance like riding a bicycle.  It is important that you listen to your body and avoid activities that put you at a higher   risk of falling.  Adequate rest and staying well hydrated are a must!  If you have questions about the safety of specific activities ask your provider.    Exposure to Children with illness: Try to avoid obvious exposure; report any symptoms to us when noted,  If you have chicken pos, red measles or mumps, you should be immune to these diseases.   Please do not take any vaccines while pregnant unless you have checked with your OB provider.  Fetal Movement: After 28 weeks we recommend you do "kick counts" twice daily.  Lie or sit down in a calm quiet environment and count your baby movements "kicks".  You should feel your baby at least 10 times per hour.  If you have not felt 10 kicks within the first hour get up, walk around and have something sweet to eat or drink then repeat for an additional hour.  If count remains less than 10 per hour notify your provider.  Fumigating: Follow your pest control agent's  advice as to how long to stay out of your home.  Ventilate the area well before re-entering.  Hemorrhoids:   Most over-the-counter preparations can be used during pregnancy.  Check your medication to see what is safe to use.  It is important to use a stool softener or fiber in your diet and to drink lots of liquids.  If hemorrhoids seem to be getting worse please call the office.   Hot Tubs:  Hot tubs Jacuzzis and saunas are not recommended while pregnant.  These increase your internal body temperature and should be avoided.  Intercourse:  Sexual intercourse is safe during pregnancy as long as you are comfortable, unless otherwise advised by your provider.  Spotting may occur after intercourse; report any bright red bleeding that is heavier than spotting.  Labor:  If you know that you are in labor, please go to the hospital.  If you are unsure, please call the office and let us help you decide what to do.  Lifting, straining, etc:  If your job requires heavy lifting or straining please check with your provider for any limitations.  Generally, you should not lift items heavier than that you can lift simply with your hands and arms (no back muscles)  Painting:  Paint fumes do not harm your pregnancy, but may make you ill and should be avoided if possible.  Latex or water based paints have less odor than oils.  Use adequate ventilation while painting.  Permanents & Hair Color:  Chemicals in hair dyes are not recommended as they cause increase hair dryness which can increase hair loss during pregnancy.  " Highlighting" and permanents are allowed.  Dye may be absorbed differently and permanents may not hold as well during pregnancy.  Sunbathing:  Use a sunscreen, as skin burns easily during pregnancy.  Drink plenty of fluids; avoid over heating.  Tanning Beds:  Because their possible side effects are still unknown, tanning beds are not recommended.  Ultrasound Scans:  Routine ultrasounds are performed  at approximately 20 weeks.  You will be able to see your baby's general anatomy an if you would like to know the gender this can usually be determined as well.  If it is questionable when you conceived you may also receive an ultrasound early in your pregnancy for dating purposes.  Otherwise ultrasound exams are not routinely performed unless there is a medical necessity.  Although you can request a scan we ask that you pay for it when   conducted because insurance does not cover " patient request" scans.  Work: If your pregnancy proceeds without complications you may work until your due date, unless your physician or employer advises otherwise.  Round Ligament Pain/Pelvic Discomfort:  Sharp, shooting pains not associated with bleeding are fairly common, usually occurring in the second trimester of pregnancy.  They tend to be worse when standing up or when you remain standing for long periods of time.  These are the result of pressure of certain pelvic ligaments called "round ligaments".  Rest, Tylenol and heat seem to be the most effective relief.  As the womb and fetus grow, they rise out of the pelvis and the discomfort improves.  Please notify the office if your pain seems different than that described.  It may represent a more serious condition.  Common Medications Safe in Pregnancy  Acne:      Constipation:  Benzoyl Peroxide     Colace  Clindamycin      Dulcolax Suppository  Topica Erythromycin     Fibercon  Salicylic Acid      Metamucil         Miralax AVOID:        Senakot   Accutane    Cough:  Retin-A       Cough Drops  Tetracycline      Phenergan w/ Codeine if Rx  Minocycline      Robitussin (Plain & DM)  Antibiotics:     Crabs/Lice:  Ceclor       RID  Cephalosporins    AVOID:  E-Mycins      Kwell  Keflex  Macrobid/Macrodantin   Diarrhea:  Penicillin      Kao-Pectate  Zithromax      Imodium AD         PUSH FLUIDS AVOID:       Cipro     Fever:  Tetracycline      Tylenol (Regular  or Extra  Minocycline       Strength)  Levaquin      Extra Strength-Do not          Exceed 8 tabs/24 hrs Caffeine:        <200mg/day (equiv. To 1 cup of coffee or  approx. 3 12 oz sodas)         Gas: Cold/Hayfever:       Gas-X  Benadryl      Mylicon  Claritin       Phazyme  **Claritin-D        Chlor-Trimeton    Headaches:  Dimetapp      ASA-Free Excedrin  Drixoral-Non-Drowsy     Cold Compress  Mucinex (Guaifenasin)     Tylenol (Regular or Extra  Sudafed/Sudafed-12 Hour     Strength)  **Sudafed PE Pseudoephedrine   Tylenol Cold & Sinus     Vicks Vapor Rub  Zyrtec  **AVOID if Problems With Blood Pressure         Heartburn: Avoid lying down for at least 1 hour after meals  Aciphex      Maalox     Rash:  Milk of Magnesia     Benadryl    Mylanta       1% Hydrocortisone Cream  Pepcid  Pepcid Complete   Sleep Aids:  Prevacid      Ambien   Prilosec       Benadryl  Rolaids       Chamomile Tea  Tums (Limit 4/day)     Unisom           Tylenol PM         Warm milk-add vanilla or  Hemorrhoids:       Sugar for taste  Anusol/Anusol H.C.  (RX: Analapram 2.5%)  Sugar Substitutes:  Hydrocortisone OTC     Ok in moderation  Preparation H      Tucks        Vaseline lotion applied to tissue with wiping    Herpes:     Throat:  Acyclovir      Oragel  Famvir  Valtrex     Vaccines:         Flu Shot Leg Cramps:       *Gardasil  Benadryl      Hepatitis A         Hepatitis B Nasal Spray:       Pneumovax  Saline Nasal Spray     Polio Booster         Tetanus Nausea:       Tuberculosis test or PPD  Vitamin B6 25 mg TID   AVOID:    Dramamine      *Gardasil  Emetrol       Live Poliovirus  Ginger Root 250 mg QID    MMR (measles, mumps &  High Complex Carbs @ Bedtime    rebella)  Sea Bands-Accupressure    Varicella (Chickenpox)  Unisom 1/2 tab TID     *No known complications           If received before Pain:         Known pregnancy;   Darvocet       Resume series  after  Lortab        Delivery  Percocet    Yeast:   Tramadol      Femstat  Tylenol 3      Gyne-lotrimin  Ultram       Monistat  Vicodin           MISC:         All Sunscreens           Hair Coloring/highlights          Insect Repellant's          (Including DEET)         Mystic Tans  

## 2022-12-26 NOTE — Telephone Encounter (Signed)
Spoke with patient to clarify all medications she is requesting. She is requesting Wellbutrin, Cymbalta and Adderall. She states she has some Cymbalta and Wellbutrin, but she stopped taking due to pregnancy and not being sure what is safe. She has been unable to function (do daily activities/work). Patient very tearful while giving these details.

## 2022-12-27 ENCOUNTER — Encounter: Payer: Self-pay | Admitting: Obstetrics and Gynecology

## 2022-12-27 NOTE — Telephone Encounter (Signed)
Spoke with [facility]. Patient information given. They are unable to schedule without speaking to patient. Referral placed and and faxed. They will contact patient to schedule appointment.

## 2022-12-27 NOTE — Telephone Encounter (Signed)
Left voicemail to return call. 

## 2022-12-28 NOTE — Telephone Encounter (Signed)
Left voicemail to return call. 

## 2022-12-28 NOTE — Telephone Encounter (Signed)
Spoke with Erie Noe (pt mom) on DPR. Advised we have been unable to reach [place] via phone or my chart to follow up on her requests. Erie Noe states she will try to reach patient and have her contact us.

## 2022-12-28 NOTE — Telephone Encounter (Signed)
Patient called. She said she was returning a call. I read through information. Gave her the number and told her to call [facility]. She verbalized understanding.

## 2023-01-23 ENCOUNTER — Encounter: Payer: Self-pay | Admitting: Licensed Practical Nurse

## 2023-03-07 DIAGNOSIS — Z3490 Encounter for supervision of normal pregnancy, unspecified, unspecified trimester: Secondary | ICD-10-CM | POA: Insufficient documentation

## 2023-03-15 DIAGNOSIS — O09522 Supervision of elderly multigravida, second trimester: Secondary | ICD-10-CM | POA: Insufficient documentation

## 2023-03-15 LAB — OB RESULTS CONSOLE HEPATITIS B SURFACE ANTIGEN: Hepatitis B Surface Ag: NEGATIVE

## 2023-03-15 LAB — OB RESULTS CONSOLE VARICELLA ZOSTER ANTIBODY, IGG: Varicella: IMMUNE

## 2023-03-15 LAB — OB RESULTS CONSOLE RUBELLA ANTIBODY, IGM: Rubella: IMMUNE

## 2023-03-20 LAB — OB RESULTS CONSOLE GC/CHLAMYDIA
Chlamydia: NEGATIVE
Neisseria Gonorrhea: NEGATIVE

## 2023-04-30 ENCOUNTER — Other Ambulatory Visit: Payer: Self-pay

## 2023-04-30 ENCOUNTER — Encounter: Payer: Self-pay | Admitting: Obstetrics and Gynecology

## 2023-04-30 ENCOUNTER — Observation Stay
Admission: EM | Admit: 2023-04-30 | Discharge: 2023-04-30 | Disposition: A | Discharge status: Home / Self Care | Payer: Medicaid Other | Attending: Obstetrics and Gynecology | Admitting: Obstetrics and Gynecology

## 2023-04-30 ENCOUNTER — Observation Stay: Payer: Medicaid Other

## 2023-04-30 DIAGNOSIS — O26899 Other specified pregnancy related conditions, unspecified trimester: Secondary | ICD-10-CM | POA: Diagnosis present

## 2023-04-30 DIAGNOSIS — O99320 Drug use complicating pregnancy, unspecified trimester: Secondary | ICD-10-CM | POA: Diagnosis present

## 2023-04-30 DIAGNOSIS — O26892 Other specified pregnancy related conditions, second trimester: Secondary | ICD-10-CM | POA: Diagnosis present

## 2023-04-30 DIAGNOSIS — F1490 Cocaine use, unspecified, uncomplicated: Secondary | ICD-10-CM | POA: Diagnosis present

## 2023-04-30 DIAGNOSIS — R1032 Left lower quadrant pain: Secondary | ICD-10-CM | POA: Insufficient documentation

## 2023-04-30 DIAGNOSIS — Z3A26 26 weeks gestation of pregnancy: Secondary | ICD-10-CM | POA: Insufficient documentation

## 2023-04-30 DIAGNOSIS — Z3480 Encounter for supervision of other normal pregnancy, unspecified trimester: Principal | ICD-10-CM

## 2023-04-30 DIAGNOSIS — K219 Gastro-esophageal reflux disease without esophagitis: Secondary | ICD-10-CM | POA: Diagnosis present

## 2023-04-30 LAB — URINALYSIS, COMPLETE (UACMP) WITH MICROSCOPIC
Bacteria, UA: NONE SEEN
Bilirubin Urine: NEGATIVE
Glucose, UA: NEGATIVE mg/dL
Hgb urine dipstick: NEGATIVE
Ketones, ur: 5 mg/dL — AB
Leukocytes,Ua: NEGATIVE
Nitrite: NEGATIVE
Protein, ur: NEGATIVE mg/dL
Specific Gravity, Urine: 1.013 (ref 1.005–1.030)
pH: 6 (ref 5.0–8.0)

## 2023-04-30 LAB — CBC
HCT: 29.7 % — ABNORMAL LOW (ref 36.0–46.0)
Hemoglobin: 10.3 g/dL — ABNORMAL LOW (ref 12.0–15.0)
MCH: 28.9 pg (ref 26.0–34.0)
MCHC: 34.7 g/dL (ref 30.0–36.0)
MCV: 83.2 fL (ref 80.0–100.0)
Platelets: 346 10*3/uL (ref 150–400)
RBC: 3.57 MIL/uL — ABNORMAL LOW (ref 3.87–5.11)
RDW: 12.4 % (ref 11.5–15.5)
WBC: 8.4 10*3/uL (ref 4.0–10.5)
nRBC: 0 % (ref 0.0–0.2)

## 2023-04-30 LAB — URINE DRUG SCREEN, QUALITATIVE (ARMC ONLY)
Amphetamines, Ur Screen: POSITIVE — AB
Barbiturates, Ur Screen: NOT DETECTED
Benzodiazepine, Ur Scrn: NOT DETECTED
Cannabinoid 50 Ng, Ur ~~LOC~~: NOT DETECTED
Cocaine Metabolite,Ur ~~LOC~~: POSITIVE — AB
MDMA (Ecstasy)Ur Screen: NOT DETECTED
Methadone Scn, Ur: NOT DETECTED
Opiate, Ur Screen: NOT DETECTED
Phencyclidine (PCP) Ur S: NOT DETECTED
Tricyclic, Ur Screen: NOT DETECTED

## 2023-04-30 LAB — COMPREHENSIVE METABOLIC PANEL
ALT: 19 U/L (ref 0–44)
AST: 16 U/L (ref 15–41)
Albumin: 3.1 g/dL — ABNORMAL LOW (ref 3.5–5.0)
Alkaline Phosphatase: 83 U/L (ref 38–126)
Anion gap: 10 (ref 5–15)
BUN: 10 mg/dL (ref 6–20)
CO2: 22 mmol/L (ref 22–32)
Calcium: 8.3 mg/dL — ABNORMAL LOW (ref 8.9–10.3)
Chloride: 103 mmol/L (ref 98–111)
Creatinine, Ser: 0.78 mg/dL (ref 0.44–1.00)
GFR, Estimated: >60 mL/min (ref >60)
Glucose, Bld: 90 mg/dL (ref 70–99)
Potassium: 3.5 mmol/L (ref 3.5–5.1)
Sodium: 135 mmol/L (ref 135–145)
Total Bilirubin: 0.4 mg/dL (ref <1.2)
Total Protein: 6.6 g/dL (ref 6.5–8.1)

## 2023-04-30 LAB — ETHANOL: Alcohol, Ethyl (B): <10 mg/dL (ref <10)

## 2023-04-30 LAB — PROTEIN / CREATININE RATIO, URINE
Creatinine, Urine: 89 mg/dL
Protein Creatinine Ratio: 0.13 mg/mg{creat} (ref 0.00–0.15)
Total Protein, Urine: 12 mg/dL

## 2023-04-30 MED ORDER — ACETAMINOPHEN 500 MG PO TABS
1000.0000 mg | ORAL_TABLET | Freq: Four times a day (QID) | ORAL | Status: DC | PRN
  Administered 2023-04-30: 1000 mg via ORAL
  Filled 2023-04-30: qty 2

## 2023-04-30 MED ORDER — FAMOTIDINE 20 MG PO TABS
40.0000 mg | ORAL_TABLET | Freq: Once | ORAL | Status: AC
  Administered 2023-04-30: 40 mg via ORAL
  Filled 2023-04-30: qty 2

## 2023-04-30 MED ORDER — PANTOPRAZOLE SODIUM 40 MG PO TBEC: 40.0000 mg | DELAYED_RELEASE_TABLET | Freq: Every day | ORAL | 2 refills | Status: DC

## 2023-04-30 MED ORDER — CALCIUM CARBONATE ANTACID 500 MG PO CHEW: 2.0000 | CHEWABLE_TABLET | ORAL | Status: DC | PRN

## 2023-04-30 NOTE — Discharge Summary (Signed)
Monica Bonilla is a 42 y.o. female. She is at [redacted]w[redacted]d gestation. Patient's last menstrual period was 10/29/2022 (approximate). 08/05/2023, by Last Menstrual Period   Prenatal care site: [facility]  Chief complaint: called the office d/t variety of concerns and instructed to come to L&D   Admission Diagnoses:  1) intrauterine pregnancy at [redacted]w[redacted]d  2) Abdominal pain affecting pregnancy [O26.899, R10.9]  Discharge Diagnoses:  Principal Problem:   Abdominal pain affecting pregnancy Active Problems:   Cocaine use complicating pregnancy   Headache in pregnancy   Gastroesophageal reflux in pregnancy    HPI: Monica Bonilla presents to L&D with complaints of abdominal pain. States her pain was LLQ last week, then RUQ yesterday, and then lower abdominal today. Reports increase in pelvic pressure when she walks around. Also reports intermittent headache yesterday. Did not have one today until recently. Also has more frequent and severe heartburn. Sometimes feels the acid coming up her throat. Heartburn causes N/V episodes in which she feels fine one moment and then has to suddenly throw up. She's been having more leg cramps, particularly at night.  Her pregnancy is complicated by AMA, history of alcohol and substance use disorder, anxiety, depression, and ADD . She is currently followed by [facility] for management of her medications and counseling. She denies Contractions, Loss of fluid, or Vaginal bleeding. Endorses fetal movement as active. Reports current headache that recently started. Denies changes in vision or current RUQ pain.   S: Resting comfortably. no CTX, no VB.no LOF,  Active fetal movement.   Maternal Medical History:  Past Medical Hx:  has a past medical history of ADD (attention deficit disorder), Anemia, Anxiety, Depression, Insomnia, Migraine, and Tobacco use.    Past Surgical Hx:  has a past surgical history that includes Fracture surgery (05/15/2011) and  Wisdom tooth extraction.   Allergies  Allergen Reactions   Buspar [Buspirone]     anxiety   Venlafaxine     agitation, tachycardia     Prior to Admission medications   Medication Sig Start Date End Date Taking? Authorizing Provider  amphetamine-dextroamphetamine (ADDERALL) 30 MG tablet Take 1 tablet by mouth 2 (two) times daily. 09/24/22  Yes Sowles, Danna Hefty, MD  clonazePAM (KLONOPIN) 0.5 MG tablet TAKE 1 TABLET BY MOUTH EVERY DAY AS NEEDED FOR ANXIETY 07/23/22  Yes Sowles, Danna Hefty, MD  pantoprazole (PROTONIX) 40 MG tablet Take 1 tablet (40 mg total) by mouth daily. 04/30/23 07/29/23 Yes Gustavo Lah, CNM  sertraline (ZOLOFT) 100 MG tablet Take 150 mg by mouth daily.   Yes [provider]  zolpidem (AMBIEN) 10 MG tablet Take 1 tablet (10 mg total) by mouth at bedtime as needed for sleep. 07/23/22  Yes Sowles, Danna Hefty, MD  amphetamine-dextroamphetamine (ADDERALL) 30 MG tablet Take 1 tablet by mouth daily. Patient not taking: Reported on 12/26/2022 07/23/22   Alba Cory, MD  amphetamine-dextroamphetamine (ADDERALL) 30 MG tablet Take 1 tablet by mouth 2 (two) times daily. 11/02/22 12/02/22  Danelle Berry, PA-C  atenolol (TENORMIN) 25 MG tablet Take 1 tablet (25 mg total) by mouth daily. Patient not taking: Reported on 12/06/2022 07/06/21   Alba Cory, MD  buPROPion [facility] SR) 150 MG 12 hr tablet Take 1 tablet (150 mg total) by mouth 2 (two) times daily. Second dose around 2 pm Patient not taking: Reported on 12/06/2022 07/23/22   Alba Cory, MD  butalbital-acetaminophen-caffeine (FIORICET) 50-325-40 MG tablet TAKE ONE TABLET BY MOUTH EVERY 4 HOURS AS NEEDED FOR HEADACHE Patient not taking:  Reported on 12/06/2022 07/09/22   Caro Laroche, DO  cyclobenzaprine (FLEXERIL) 10 MG tablet Take 1 tablet (10 mg total) by mouth 3 (three) times daily as needed. Patient not taking: Reported on 12/06/2022 08/08/22   Faythe Ghee, PA-C  DULoxetine (CYMBALTA) 60 MG capsule Take 1  capsule (60 mg total) by mouth daily. Patient not taking: Reported on 12/06/2022 07/23/22   Alba Cory, MD  meloxicam (MOBIC) 15 MG tablet Take 1 tablet (15 mg total) by mouth daily. Patient not taking: Reported on 12/06/2022 08/08/22 08/08/23  Sherrie Mustache Roselyn Bering, PA-C  oxyCODONE-acetaminophen (PERCOCET) 5-325 MG tablet Take 1 tablet by mouth every 4 (four) hours as needed for severe pain. Patient not taking: Reported on 12/06/2022 08/08/22 08/08/23  Faythe Ghee, PA-C  topiramate (TOPAMAX) 100 MG tablet Take 1 tablet (100 mg total) by mouth 2 (two) times daily. Patient not taking: Reported on 12/06/2022 07/23/22   Alba Cory, MD    Social History: She  reports that she quit smoking about 8 years ago. Her smoking use included e-cigarettes and cigarettes. She started smoking about 9 years ago. She has never used smokeless tobacco. She reports current alcohol use of about 1.0 standard drink of alcohol per week. She reports that she does not use drugs.  Family History: family history includes Bipolar disorder in her father; Diabetes in her maternal grandfather; Healthy in her sister; Heart Problems in her maternal grandfather; Heart attack in her paternal grandfather; Hypertension in her maternal grandfather, maternal grandmother, and mother; Hyperthyroidism in her father; Kidney failure in her maternal grandmother; Pernicious anemia in her sister.   Review of Systems: A full review of systems was performed and negative except as noted in the HPI.     Pertinent Results:   O:  BP 104/77   Pulse (!) 102   Temp 98.2 F (36.8 C) (Oral)   Resp 18   Ht 5\' 6"  (1.676 m)   Wt 82.6 kg   LMP 10/29/2022 (Approximate)   BMI 29.38 kg/m  Results for orders placed or performed during the hospital encounter of 04/30/23 (from the past 48 hours)  Urinalysis, Complete w Microscopic -Urine, Clean Catch   Collection Time: 04/30/23  3:50 PM  Result Value Ref Range   Color, Urine YELLOW (A) YELLOW    APPearance CLEAR (A) CLEAR   Specific Gravity, Urine 1.013 1.005 - 1.030   pH 6.0 5.0 - 8.0   Glucose, UA NEGATIVE NEGATIVE mg/dL   Hgb urine dipstick NEGATIVE NEGATIVE   Bilirubin Urine NEGATIVE NEGATIVE   Ketones, ur 5 (A) NEGATIVE mg/dL   Protein, ur NEGATIVE NEGATIVE mg/dL   Nitrite NEGATIVE NEGATIVE   Leukocytes,Ua NEGATIVE NEGATIVE   RBC / HPF 0-5 0 - 5 RBC/hpf   WBC, UA 0-5 0 - 5 WBC/hpf   Bacteria, UA NONE SEEN NONE SEEN   Squamous Epithelial / HPF 0-5 0 - 5 /HPF   Mucus PRESENT   Urine Drug Screen, Qualitative ([facility] only)   Collection Time: 04/30/23  3:50 PM  Result Value Ref Range   Tricyclic, Ur Screen NONE DETECTED NONE DETECTED   Amphetamines, Ur Screen POSITIVE (A) NONE DETECTED   MDMA (Ecstasy)Ur Screen NONE DETECTED NONE DETECTED   Cocaine Metabolite,Ur [place] POSITIVE (A) NONE DETECTED   Opiate, Ur Screen NONE DETECTED NONE DETECTED   Phencyclidine (PCP) Ur S NONE DETECTED NONE DETECTED   Cannabinoid 50 Ng, Ur  NONE DETECTED NONE DETECTED   Barbiturates, Ur Screen NONE DETECTED NONE DETECTED  Benzodiazepine, Ur Scrn NONE DETECTED NONE DETECTED   Methadone Scn, Ur NONE DETECTED NONE DETECTED  Comprehensive metabolic panel   Collection Time: 04/30/23  3:50 PM  Result Value Ref Range   Sodium 135 135 - 145 mmol/L   Potassium 3.5 3.5 - 5.1 mmol/L   Chloride 103 98 - 111 mmol/L   CO2 22 22 - 32 mmol/L   Glucose, Bld 90 70 - 99 mg/dL   BUN 10 6 - 20 mg/dL   Creatinine, Ser 1.61 0.44 - 1.00 mg/dL   Calcium 8.3 (L) 8.9 - 10.3 mg/dL   Total Protein 6.6 6.5 - 8.1 g/dL   Albumin 3.1 (L) 3.5 - 5.0 g/dL   AST 16 15 - 41 U/L   ALT 19 0 - 44 U/L   Alkaline Phosphatase 83 38 - 126 U/L   Total Bilirubin 0.4 <1.2 mg/dL   GFR, Estimated >09 >60 mL/min   Anion gap 10 5 - 15  CBC   Collection Time: 04/30/23  3:50 PM  Result Value Ref Range   WBC 8.4 4.0 - 10.5 K/uL   RBC 3.57 (L) 3.87 - 5.11 MIL/uL   Hemoglobin 10.3 (L) 12.0 - 15.0 g/dL   HCT 45.4 (L) 09.8 - 11.9 %    MCV 83.2 80.0 - 100.0 fL   MCH 28.9 26.0 - 34.0 pg   MCHC 34.7 30.0 - 36.0 g/dL   RDW 14.7 82.9 - 56.2 %   Platelets 346 150 - 400 K/uL   nRBC 0.0 0.0 - 0.2 %  Protein / creatinine ratio, urine   Collection Time: 04/30/23  3:50 PM  Result Value Ref Range   Creatinine, Urine 89 mg/dL   Total Protein, Urine 12 mg/dL   Protein Creatinine Ratio 0.13 0.00 - 0.15 mg/mg[Cre]  Ethanol   Collection Time: 04/30/23  7:53 PM  Result Value Ref Range   Alcohol, Ethyl (B) <10 <10 mg/dL    US OB Limited Result Date: 04/30/2023 CLINICAL DATA:  Abdominal pain during pregnancy EXAM: LIMITED OBSTETRIC ULTRASOUND COMPARISON:  Obstetrical ultrasound 12/14/2022 FINDINGS: Number of Fetuses: 1 Heart Rate:  136 bpm Movement: Yes Presentation: Cephalic Placental Location: Anterior fundal Previa: No Amniotic Fluid (Subjective):  Normal BPD: 6.7 cm 27 w  0 d MATERNAL FINDINGS: Cervix:  Appears closed.  Measures 3.9 cm in length. Uterus/Adnexae: No abnormality visualized. IMPRESSION: 1. Single live intrauterine gestation in cephalic position measuring 27 weeks 0 days. No acute abnormality. This exam is performed on an emergent basis and does not comprehensively evaluate fetal size, dating, or anatomy; follow-up complete OB US should be considered if further fetal assessment is warranted. Electronically Signed   By: Darliss Cheney M.D.   On: 04/30/2023 20:07    Constitutional: NAD, AAOx3  PULM: nl respiratory effort Abd: gravid, non-tender Ext: Non-tender, Nonedmeatous Psych: mood appropriate, speech normal Pelvic : deferred  NST: Baseline FHR: 150 beats/min Variability: moderate Accelerations: present, 10x10 Decelerations: absent  Tocometry: occasional, mild contractions  Time: at least 20 minutes   Interpretation: Category I INDICATIONS: rule out uterine contractions RESULTS:  A NST procedure was performed with FHR monitoring and a normal baseline established, appropriate time of 20-40 minutes of  evaluation, and accels >2 seen w 15x15 characteristics.  Results show a REACTIVE NST.   Consults: None  Procedures: NST, [place]   Hospital Course: The patient was admitted to Labor and Delivery Triage for observation. Blood pressures were all normal range. Preeclampsia labs were normal. Mild anemia in pregnancy noted. Recommend iron  supplement daily. UDS positive for both amphetamine and cocaine. Also reports drinking a glass of wine on Friday. Medication list reviewed. Currently takes Adderrall as prescribed. Reports last cocaine use was over a month ago but FOB did use daily. Has not been around him for over a week as he is working on decreasing and stopping cocaine use. She is unsure where a recent exposure may have happened.  Discussed the importance of avoiding cocaine, especially in pregnancy. Reviewed possible effects in pregnancy to include preterm labor, abruption, and fetal demise. She has several prescriptions to help manage her anxiety, depression, and ADD symptoms which could have adverse interactions with cocaine metabolites. Fetal monitoring remained reassuring and OB US was normal. Rx for protonix was sent to pharmacy. Recommend magnesium at night to help with leg cramps. Maternity support belt to help with pelvic pressure. Avoid substances like cocaine and alcohol, particularly in pregnancy.  She was deemed stable for discharge and further outpatient management.    Discharge Condition: stable  Disposition: Discharge disposition: 01-Home or Self Care      Allergies as of 04/30/2023       Reactions   Buspar [buspirone]    anxiety   Venlafaxine    agitation, tachycardia        Medication List     STOP taking these medications    atenolol 25 MG tablet Commonly known as: TENORMIN   buPROPion 150 MG 12 hr tablet Commonly known as: WELLBUTRIN SR   butalbital-acetaminophen-caffeine 50-325-40 MG tablet Commonly known as: FIORICET   cyclobenzaprine 10 MG tablet Commonly  known as: FLEXERIL   DULoxetine 60 MG capsule Commonly known as: Cymbalta   meloxicam 15 MG tablet Commonly known as: MOBIC   oxyCODONE-acetaminophen 5-325 MG tablet Commonly known as: Percocet   topiramate 100 MG tablet Commonly known as: TOPAMAX       TAKE these medications    amphetamine-dextroamphetamine 30 MG tablet Commonly known as: Adderall Take 1 tablet by mouth 2 (two) times daily. What changed: Another medication with the same name was removed. Continue taking this medication, and follow the directions you see here.   clonazePAM 0.5 MG tablet Commonly known as: KLONOPIN TAKE 1 TABLET BY MOUTH EVERY DAY AS NEEDED FOR ANXIETY   pantoprazole 40 MG tablet Commonly known as: Protonix Take 1 tablet (40 mg total) by mouth daily.   sertraline 100 MG tablet Commonly known as: ZOLOFT Take 150 mg by mouth daily.   zolpidem 10 MG tablet Commonly known as: AMBIEN Take 1 tablet (10 mg total) by mouth at bedtime as needed for sleep.        Follow-up Information     [facility]. Schedule an appointment as soon as possible for a visit in 1 week(s).   Why: routine prenatal appointment Contact information: [address] [place] [address] [phone]               ----- Gustavo Lah, CNM  Certified Nurse Midwife [place]  [facility]

## 2023-04-30 NOTE — OB Triage Note (Signed)
Patient is a 42 yo, G2P1, at 26 weeks 1 days. Patient presents with complaints of abdominal pain, heartburn, and vomiting. Patient states she has had intense intermittent right upper quadrant pain that was accompanied by 2 episodes of vomiting yesterday. Patient states the heartburn started last Wednesday and has gotten progressively more intense since then. Patient is also complaining of left lower abdominal pain now and increased episodes of urinary frequency/incontinence. Patient states she took her BP last night and it was 150s/100s. Recheck in L&D today is 121/76. Patient denies any vaginal bleeding or LOF. Patient reports occasional tightening in her abdomen at night time. Patient reports +FM. Monitors applied and assessing. VSS. Initial fetal heart tone 155. Mackie CNM notified of patients arrival to unit.

## 2023-05-06 LAB — OB RESULTS CONSOLE RPR: RPR: NONREACTIVE

## 2023-05-10 LAB — COCAINE METABOLITE CONFIRM,UR: Cocaine Metab Quant, Ur: POSITIVE — AB

## 2023-05-15 NOTE — L&D Delivery Note (Signed)
 Delivery Note At  a viable boy  newborn was delivered via  (Presentation:  vtx     ).  APGAR: 2/8, ; weight  pending.   Placenta status:intact after manual delivery   ,  .  Cord:   with the following complications:  .  Cord pH: N/A  Anesthesia:  cle Episiotomy: none  Lacerations:  small left perilabial Suture Repair:  n/a Est. Blood Loss (mL):   200cc Mom to postpartum.  Baby to  currently bonding .  Mother pushed with CNM Liboon . 2 pushes with rapid delivery of head and body . Long cord wrapped on shoulder .I arrived with baby being placed on mother's abdomen for delayed cord clamping . Cord clamping at approx 45 sec by nursery staff. Excessive cord noted . CNM while delivering placenta avulsed cord. I then performed a manual extraction with out difficulty . Placenta appeared intact . IV Pitocin . Good hemostasis.  Ihor Austin Lorree Millar 07/02/2023, 7:43 AM

## 2023-05-15 NOTE — L&D Delivery Note (Signed)
 Delivery Note  Monica Bonilla is a [id number] at [redacted]w[redacted]d with an LMP of 10/29/2022, consistent with [place] at [redacted]w[redacted]d.   First Stage: Labor onset: 0245 on 02/17, per patient  Augmentation: oxytocin Analgesia /Anesthesia intrapartum: regional anesthesia  SROM at 0245 on 02/17 GBS: positive IP Antibiotics: penicillin x 3   Second Stage: Complete dilation at 0715 Onset of pushing at 0725 FHR second stage 130 bpm with moderate variability, variable decels with pushing   Monica Bonilla presented to L&D with PPROM. She was augmented with IV Pitocin. She progressed  to 10/100/+1 with a spontaneous urge to push.  She pushed  effectively over approximately 4 minutes for a spontaneous vaginal birth.  Delivery of a viable baby boy on 07/02/2023 at 0729 by CNM Delivery of fetal head in LOA position with restitution to LOT. Nuchal cord x 1 around shoulder;  Anterior then posterior shoulders delivered easily with gentle downward traction. Baby placed on mom's chest, and attended to by baby RN Cord double clamped after cessation of pulsation, cut by Roney Jaffe, CNM   TFeliberto Gottron, MD at bedside to supervise delivery  Cord blood sample collection: Not Indicated A POS Performed at [facility], [address] [place], [place] [address]  Collection of cord blood donation: None  Arterial cord blood sample: None   Third Stage: Oxytocin bolus started after delivery of infant for hemorrhage prophylaxis  Placenta delivered [name] intact with 3 VC @ 0734 Placenta disposition: Discarded  Uterine tone Firm / bleeding minimal IV Pitocin for hemorrhage prophylaxis  No laceration identified; right labial abrasion Anesthesia used for repair: N/A Suture Repair: N/A Est. Blood Loss (mL): 25 mL  Complications: Cord avulsed; Manual removal of placenta by T.Schermerhorn, MD   Mom to postpartum.  Baby to Couplet care / Skin to Skin.  Newborn: Information for the patient's newborn:  Ibeth, Fahmy [[id number]]  Live born child  Birth Weight:   APGAR: ,   Newborn Delivery   Birth date/time: 07/02/2023 07:29:00 Delivery type: Vaginal, Spontaneous      Feeding planned: breast feeding  ---------- Roney Jaffe CNM Certified Nurse Midwife [facility]

## 2023-06-25 LAB — OB RESULTS CONSOLE HIV ANTIBODY (ROUTINE TESTING): HIV: NONREACTIVE

## 2023-07-01 ENCOUNTER — Inpatient Hospital Stay
Admission: EM | Admit: 2023-07-01 | Discharge: 2023-07-04 | DRG: 797 | Disposition: A | Discharge status: Home / Self Care | Payer: Medicaid Other

## 2023-07-01 ENCOUNTER — Encounter: Payer: Self-pay | Admitting: Obstetrics and Gynecology

## 2023-07-01 ENCOUNTER — Other Ambulatory Visit: Payer: Self-pay

## 2023-07-01 DIAGNOSIS — O99344 Other mental disorders complicating childbirth: Secondary | ICD-10-CM | POA: Diagnosis present

## 2023-07-01 DIAGNOSIS — Z3A35 35 weeks gestation of pregnancy: Secondary | ICD-10-CM | POA: Diagnosis not present

## 2023-07-01 DIAGNOSIS — O9081 Anemia of the puerperium: Secondary | ICD-10-CM | POA: Diagnosis not present

## 2023-07-01 DIAGNOSIS — F419 Anxiety disorder, unspecified: Secondary | ICD-10-CM | POA: Diagnosis present

## 2023-07-01 DIAGNOSIS — Z8249 Family history of ischemic heart disease and other diseases of the circulatory system: Secondary | ICD-10-CM

## 2023-07-01 DIAGNOSIS — Z87891 Personal history of nicotine dependence: Secondary | ICD-10-CM | POA: Diagnosis not present

## 2023-07-01 DIAGNOSIS — O99324 Drug use complicating childbirth: Secondary | ICD-10-CM | POA: Diagnosis present

## 2023-07-01 DIAGNOSIS — F1490 Cocaine use, unspecified, uncomplicated: Secondary | ICD-10-CM | POA: Diagnosis present

## 2023-07-01 DIAGNOSIS — O99824 Streptococcus B carrier state complicating childbirth: Secondary | ICD-10-CM | POA: Diagnosis present

## 2023-07-01 DIAGNOSIS — F32A Depression, unspecified: Secondary | ICD-10-CM | POA: Diagnosis present

## 2023-07-01 DIAGNOSIS — Z888 Allergy status to other drugs, medicaments and biological substances status: Secondary | ICD-10-CM

## 2023-07-01 DIAGNOSIS — O42913 Preterm premature rupture of membranes, unspecified as to length of time between rupture and onset of labor, third trimester: Principal | ICD-10-CM | POA: Diagnosis present

## 2023-07-01 DIAGNOSIS — O42919 Preterm premature rupture of membranes, unspecified as to length of time between rupture and onset of labor, unspecified trimester: Secondary | ICD-10-CM | POA: Diagnosis present

## 2023-07-01 DIAGNOSIS — O99320 Drug use complicating pregnancy, unspecified trimester: Principal | ICD-10-CM

## 2023-07-01 DIAGNOSIS — O4290 Premature rupture of membranes, unspecified as to length of time between rupture and onset of labor, unspecified weeks of gestation: Secondary | ICD-10-CM | POA: Diagnosis present

## 2023-07-01 DIAGNOSIS — D62 Acute posthemorrhagic anemia: Secondary | ICD-10-CM | POA: Diagnosis not present

## 2023-07-01 DIAGNOSIS — K219 Gastro-esophageal reflux disease without esophagitis: Secondary | ICD-10-CM | POA: Diagnosis present

## 2023-07-01 DIAGNOSIS — O26893 Other specified pregnancy related conditions, third trimester: Secondary | ICD-10-CM

## 2023-07-01 DIAGNOSIS — Z833 Family history of diabetes mellitus: Secondary | ICD-10-CM | POA: Diagnosis not present

## 2023-07-01 DIAGNOSIS — O9962 Diseases of the digestive system complicating childbirth: Secondary | ICD-10-CM | POA: Diagnosis present

## 2023-07-01 LAB — URINALYSIS, COMPLETE (UACMP) WITH MICROSCOPIC
Bilirubin Urine: NEGATIVE
Glucose, UA: NEGATIVE mg/dL
Ketones, ur: NEGATIVE mg/dL
Leukocytes,Ua: NEGATIVE
Nitrite: NEGATIVE
Protein, ur: 100 mg/dL — AB
RBC / HPF: >50 RBC/hpf (ref 0–5)
Specific Gravity, Urine: 1.006 (ref 1.005–1.030)
WBC, UA: >50 WBC/hpf (ref 0–5)
pH: 7 (ref 5.0–8.0)

## 2023-07-01 LAB — CBC
HCT: 34.8 % — ABNORMAL LOW (ref 36.0–46.0)
Hemoglobin: 11.8 g/dL — ABNORMAL LOW (ref 12.0–15.0)
MCH: 28.1 pg (ref 26.0–34.0)
MCHC: 33.9 g/dL (ref 30.0–36.0)
MCV: 82.9 fL (ref 80.0–100.0)
Platelets: 303 10*3/uL (ref 150–400)
RBC: 4.2 MIL/uL (ref 3.87–5.11)
RDW: 14.7 % (ref 11.5–15.5)
WBC: 10.6 10*3/uL — ABNORMAL HIGH (ref 4.0–10.5)
nRBC: 0 % (ref 0.0–0.2)

## 2023-07-01 LAB — URINE DRUG SCREEN, QUALITATIVE (ARMC ONLY)
Amphetamines, Ur Screen: NOT DETECTED
Barbiturates, Ur Screen: NOT DETECTED
Benzodiazepine, Ur Scrn: NOT DETECTED
Cannabinoid 50 Ng, Ur ~~LOC~~: NOT DETECTED
Cocaine Metabolite,Ur ~~LOC~~: NOT DETECTED
MDMA (Ecstasy)Ur Screen: NOT DETECTED
Methadone Scn, Ur: NOT DETECTED
Opiate, Ur Screen: NOT DETECTED
Phencyclidine (PCP) Ur S: NOT DETECTED
Tricyclic, Ur Screen: NOT DETECTED

## 2023-07-01 LAB — WET PREP, GENITAL
Clue Cells Wet Prep HPF POC: NONE SEEN
Sperm: NONE SEEN
Trich, Wet Prep: NONE SEEN
WBC, Wet Prep HPF POC: <10 (ref <10)
Yeast Wet Prep HPF POC: NONE SEEN

## 2023-07-01 LAB — CHLAMYDIA/NGC RT PCR (ARMC ONLY)
Chlamydia Tr: NOT DETECTED
N gonorrhoeae: NOT DETECTED

## 2023-07-01 LAB — GROUP B STREP BY PCR: Group B strep by PCR: POSITIVE — AB

## 2023-07-01 LAB — TYPE AND SCREEN
ABO/RH(D): A POS
Antibody Screen: NEGATIVE

## 2023-07-01 LAB — RUPTURE OF MEMBRANE (ROM)PLUS: Rom Plus: POSITIVE

## 2023-07-01 LAB — ABO/RH: ABO/RH(D): A POS

## 2023-07-01 LAB — HIV ANTIBODY (ROUTINE TESTING W REFLEX): HIV Screen 4th Generation wRfx: NONREACTIVE

## 2023-07-01 MED ORDER — LIDOCAINE HCL (PF) 1 % IJ SOLN: 30.0000 mL | INTRAMUSCULAR | Status: DC | PRN

## 2023-07-01 MED ORDER — SODIUM CHLORIDE 0.9 % IV SOLN
2.0000 g | Freq: Four times a day (QID) | INTRAVENOUS | Status: DC
  Administered 2023-07-01: 2 g via INTRAVENOUS
  Filled 2023-07-01: qty 2000

## 2023-07-01 MED ORDER — OXYTOCIN-SODIUM CHLORIDE 30-0.9 UT/500ML-% IV SOLN
2.5000 [IU]/h | INTRAVENOUS | Status: DC
  Administered 2023-07-02: 2.5 [IU]/h via INTRAVENOUS

## 2023-07-01 MED ORDER — AMMONIA AROMATIC IN INHA
RESPIRATORY_TRACT | Status: DC
Start: 2023-07-01 — End: 2023-07-02
  Filled 2023-07-01: qty 10

## 2023-07-01 MED ORDER — OXYTOCIN 10 UNIT/ML IJ SOLN
INTRAMUSCULAR | Status: AC
  Filled 2023-07-01: qty 2

## 2023-07-01 MED ORDER — OXYTOCIN BOLUS FROM INFUSION
333.0000 mL | Freq: Once | INTRAVENOUS | Status: AC
  Administered 2023-07-02: 333 mL via INTRAVENOUS

## 2023-07-01 MED ORDER — MISOPROSTOL 200 MCG PO TABS
ORAL_TABLET | ORAL | Status: DC
Start: 2023-07-01 — End: 2023-07-02
  Filled 2023-07-01: qty 4

## 2023-07-01 MED ORDER — SODIUM CHLORIDE 0.9% FLUSH: 3.0000 mL | Freq: Two times a day (BID) | INTRAVENOUS | Status: DC

## 2023-07-01 MED ORDER — LACTATED RINGERS IV SOLN: INTRAVENOUS | Status: DC

## 2023-07-01 MED ORDER — PENICILLIN G POT IN DEXTROSE 60000 UNIT/ML IV SOLN
3.0000 10*6.[IU] | INTRAVENOUS | Status: DC
  Administered 2023-07-01 – 2023-07-02 (×3): 3 10*6.[IU] via INTRAVENOUS
  Filled 2023-07-01 (×3): qty 50

## 2023-07-01 MED ORDER — ACETAMINOPHEN 500 MG PO TABS
1000.0000 mg | ORAL_TABLET | Freq: Four times a day (QID) | ORAL | Status: DC | PRN
  Filled 2023-07-01: qty 2

## 2023-07-01 MED ORDER — FENTANYL CITRATE (PF) 100 MCG/2ML IJ SOLN
50.0000 ug | INTRAMUSCULAR | Status: DC | PRN
  Administered 2023-07-01 – 2023-07-02 (×2): 50 ug via INTRAVENOUS
  Filled 2023-07-01 (×2): qty 2

## 2023-07-01 MED ORDER — ONDANSETRON HCL 4 MG/2ML IJ SOLN: 4.0000 mg | Freq: Four times a day (QID) | INTRAMUSCULAR | Status: DC | PRN

## 2023-07-01 MED ORDER — SODIUM CHLORIDE 0.9% FLUSH: 3.0000 mL | INTRAVENOUS | Status: DC | PRN

## 2023-07-01 MED ORDER — OXYTOCIN-SODIUM CHLORIDE 30-0.9 UT/500ML-% IV SOLN
1.0000 m[IU]/min | INTRAVENOUS | Status: DC
Start: 2023-07-01 — End: 2023-07-02
  Administered 2023-07-01: 2 m[IU]/min via INTRAVENOUS
  Filled 2023-07-01 (×2): qty 500

## 2023-07-01 MED ORDER — SOD CITRATE-CITRIC ACID 500-334 MG/5ML PO SOLN: 30.0000 mL | ORAL | Status: DC | PRN

## 2023-07-01 MED ORDER — SODIUM CHLORIDE 0.9 % IV SOLN: 250.0000 mL | INTRAVENOUS | Status: DC | PRN

## 2023-07-01 MED ORDER — TERBUTALINE SULFATE 1 MG/ML IJ SOLN: 0.2500 mg | Freq: Once | INTRAMUSCULAR | Status: DC | PRN

## 2023-07-01 MED ORDER — LACTATED RINGERS IV SOLN: 500.0000 mL | INTRAVENOUS | Status: DC | PRN

## 2023-07-01 MED ORDER — SODIUM CHLORIDE 0.9 % IV SOLN
5.0000 10*6.[IU] | Freq: Once | INTRAVENOUS | Status: AC
  Administered 2023-07-01: 5 10*6.[IU] via INTRAVENOUS
  Filled 2023-07-01 (×3): qty 5

## 2023-07-01 MED ORDER — LIDOCAINE HCL (PF) 1 % IJ SOLN
INTRAMUSCULAR | Status: DC
Start: 2023-07-01 — End: 2023-07-02
  Filled 2023-07-01: qty 30

## 2023-07-01 NOTE — Progress Notes (Signed)
 Monica Bonilla is a 42 y.o. G2P1001 at [redacted]w[redacted]d LMP admitted for PPROM  Subjective: LOF this am 0245  No significant ctx . No bleeding  + H/o cocaine and amphetamine use 2 months ago . Neg UDS today + GBS on PCR  today  Objective: BP 126/82 (BP Location: Right Arm)   Pulse 89   Temp 97.7 F (36.5 C) (Oral)   Resp 16   Ht 5\' 6"  (1.676 m)   Wt 88 kg   LMP 10/29/2022 (Approximate)   BMI 31.31 kg/m  No intake/output data recorded. No intake/output data recorded.  FHT:  FHR: 145 bpm, variability: moderate,  accelerations:  Present,  decelerations:  Absent UC:   irregular  SVE:   Dilation: 2.5 Effacement (%): Thick Station: -3 Exam by:: Set designer  VTX by U/S this am   Labs: Lab Results  Component Value Date   WBC 10.6 (H) 07/01/2023   HGB 11.8 (L) 07/01/2023   HCT 34.8 (L) 07/01/2023   MCV 82.9 07/01/2023   PLT 303 07/01/2023    Assessment / Plan: PPROM  Cat 1 fetal monitoring  I spoke to the patient about the pros and cons of expectant management vs delivery (,risk of infection , RDS , PPH , NICU admission  etc .) Option of expectant management with GBS tx and steroid betamethasone 12 mg and repeat in 24 hrs  vs moving to delivery now with GBS prophylaxis . Pediatrician requested to advise on 35 week management in SCN  or NICU . Pt will then talk to FOB and let us know which route to undertake .  Ampicillin currently being administered .    Suzy Bouchard, MD 07/01/2023, 11:23 AM

## 2023-07-01 NOTE — OB Triage Note (Signed)
 Patient is a 43 yo, G2P1, at 35 weeks 0 days. Patient presents with complaints of LOF. Patient states she felt a large gush of fluid around 0245 this morning while she was laying in bed. Since then she reports she has had several smaller gushes of fluid. Patient denies any vaginal bleeding. Patient reports +FM. Patient denies any regular or consistent contractions. Monitors applied and assessing. VSS. Initial fetal heart tone 150. Liboon CNM notified of patients arrival to unit. Plan to place in observation for NST, GBS swab, ROM+, and UA.

## 2023-07-01 NOTE — Progress Notes (Signed)
 L&D Note    Subjective:  Patient up in rocking chair. States she feels "contractions every now and then, but they are not strong."  Objective:   Vitals:   07/01/23 1246 07/01/23 1444 07/01/23 1700 07/01/23 2003  BP: 103/75 108/71  109/68  Pulse: 82 77  86  Resp:  18  14  Temp: 97.7 F (36.5 C) 97.7 F (36.5 C) 98.6 F (37 C) 98.8 F (37.1 C)  TempSrc: Oral Oral Oral Oral  Weight:      Height:        Current Vital Signs 24h Vital Sign Ranges  T 98.8 F (37.1 C) Temp  Avg: 98.1 F (36.7 C)  Min: 97.7 F (36.5 C)  Max: 98.8 F (37.1 C)  BP 109/68 BP  Min: 103/75  Max: 126/82  HR 86 Pulse  Avg: 83.5  Min: 77  Max: 89  RR 14 Resp  Avg: 16  Min: 14  Max: 18  SaO2     No data recorded      Gen: alert, cooperative, no distress FHR: Baseline: 150 bpm, Variability: moderate, Accels: Present, Decels: none Toco: regular, every 1.5-3 minutes SVE: Dilation: 4.5 Effacement (%): 50 Cervical Position: Middle Station: -1 Presentation: Vertex Exam by:: Johnsie Moscoso, CNM  Medications SCHEDULED MEDICATIONS   ammonia       lidocaine (PF)       misoprostol       oxytocin       oxytocin 40 units in LR 1000 mL  333 mL Intravenous Once    MEDICATION INFUSIONS   lactated ringers     lactated ringers 125 mL/hr at 07/01/23 1808   oxytocin 12 milli-units/min (07/01/23 1800)   oxytocin     pencillin G potassium IV 3 Million Units (07/01/23 2004)    PRN MEDICATIONS  acetaminophen, ammonia, fentaNYL (SUBLIMAZE) injection, lactated ringers, lidocaine (PF), lidocaine (PF), misoprostol, ondansetron, oxytocin, sodium citrate-citric acid, terbutaline   Assessment & Plan:  43 y.o. G2P1001 at [redacted]w[redacted]d admitted for preterm premature rupture of membranes.  -Labor: Satisfactory labor progress. IV Pitocin currently infusing @ 12. IUPC placement discussed with patient at next cervical exam. Patient agreed to IUPC placement and will plan to insert at next cervical exam.  -Fetal Well-being: Category I -GBS:  positive  - Received Penicillin x 2 -Membranes: SROM @ 0245 on 02/17, per patient  - Encourage ambulation in room. Patient desires to use birthing ball. Augmentation: Continue to increase IV Pitocin as indicated.  -Analgesia: IVPM and regional anesthesia per request of patient    Roney Jaffe, CNM  07/01/2023 9:46 PM  [facility]

## 2023-07-01 NOTE — Progress Notes (Signed)
Up to walk.

## 2023-07-01 NOTE — Progress Notes (Addendum)
 L&D Note    Subjective:  Sitting comfortably upright in bed.   Objective:   Vitals:   07/01/23 0855 07/01/23 1033 07/01/23 1246 07/01/23 1444  BP: 126/82  103/75 108/71  Pulse: 89  82 77  Resp: 16   18  Temp: 97.7 F (36.5 C) 98 F (36.7 C) 97.7 F (36.5 C) 97.7 F (36.5 C)  TempSrc: Oral Oral Oral Oral  Weight: 88 kg     Height: 5\' 6"  (1.676 m)       Current Vital Signs 24h Vital Sign Ranges  T 97.7 F (36.5 C) Temp  Avg: 97.8 F (36.6 C)  Min: 97.7 F (36.5 C)  Max: 98 F (36.7 C)  BP 108/71 BP  Min: 103/75  Max: 126/82  HR 77 Pulse  Avg: 82.7  Min: 77  Max: 89  RR 18 Resp  Avg: 17  Min: 16  Max: 18  SaO2     No data recorded      Gen: alert, cooperative, no distress FHR: Baseline: 145 bpm, Variability: moderate, Accels: Present, Decels: none Toco: regular, every 4-5 minutes SVE: Dilation: 3 Effacement (%): 40 Cervical Position: Posterior Station: -2 Presentation: Vertex Exam by:: Sloan Takagi CNM  Medications SCHEDULED MEDICATIONS   ammonia       lidocaine (PF)       misoprostol       oxytocin       oxytocin 40 units in LR 1000 mL  333 mL Intravenous Once    MEDICATION INFUSIONS   lactated ringers     lactated ringers 125 mL/hr at 07/01/23 1036   oxytocin 8 milli-units/min (07/01/23 1615)   oxytocin     pencillin G potassium IV      PRN MEDICATIONS  ammonia, lidocaine (PF), misoprostol, oxytocin, acetaminophen, fentaNYL (SUBLIMAZE) injection, lactated ringers, lidocaine (PF), ondansetron, sodium citrate-citric acid, terbutaline   Assessment & Plan:  43 y.o. G2P1001 at [redacted]w[redacted]d admitted for preterm premature rupture of membranes.  -Labor: IV Pitocin started @ 1430 currently infusing at 8. Satisfactory labor progress.  -Fetal Well-being: Category I -GBS: positive  - Received Penicillin x 1  -Membranes: SROM @ 0245 on 02/17, per patient -Augmentation: Continue to increase IV Pitocin as indicated  -Analgesia: IVPM per request of patient   Roney Jaffe,  CNM  07/01/2023 4:24 PM  [facility]
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
## 2023-07-02 ENCOUNTER — Inpatient Hospital Stay: Payer: Medicaid Other | Admitting: Anesthesiology

## 2023-07-02 ENCOUNTER — Encounter: Payer: Self-pay | Admitting: Obstetrics and Gynecology

## 2023-07-02 LAB — RPR: RPR Ser Ql: NONREACTIVE

## 2023-07-02 MED ORDER — SENNOSIDES-DOCUSATE SODIUM 8.6-50 MG PO TABS
2.0000 | ORAL_TABLET | Freq: Every day | ORAL | Status: DC
  Administered 2023-07-03 – 2023-07-04 (×2): 2 via ORAL
  Filled 2023-07-02 (×2): qty 2

## 2023-07-02 MED ORDER — FENTANYL-BUPIVACAINE-NACL 0.5-0.125-0.9 MG/250ML-% EP SOLN
12.0000 mL/h | EPIDURAL | Status: DC | PRN
  Administered 2023-07-02: 12 mL/h via EPIDURAL

## 2023-07-02 MED ORDER — FERROUS SULFATE 325 (65 FE) MG PO TABS
325.0000 mg | ORAL_TABLET | Freq: Two times a day (BID) | ORAL | Status: DC
  Administered 2023-07-02 – 2023-07-04 (×4): 325 mg via ORAL
  Filled 2023-07-02 (×4): qty 1

## 2023-07-02 MED ORDER — IBUPROFEN 600 MG PO TABS
600.0000 mg | ORAL_TABLET | Freq: Four times a day (QID) | ORAL | Status: DC
  Administered 2023-07-02 – 2023-07-03 (×5): 600 mg via ORAL
  Filled 2023-07-02 (×5): qty 1

## 2023-07-02 MED ORDER — LIDOCAINE HCL (PF) 1 % IJ SOLN
INTRAMUSCULAR | Status: DC | PRN
  Administered 2023-07-02: 4 mL via SUBCUTANEOUS

## 2023-07-02 MED ORDER — DIPHENHYDRAMINE HCL 50 MG/ML IJ SOLN: 12.5000 mg | INTRAMUSCULAR | Status: DC | PRN

## 2023-07-02 MED ORDER — AMPHETAMINE-DEXTROAMPHETAMINE 10 MG PO TABS
30.0000 mg | ORAL_TABLET | Freq: Two times a day (BID) | ORAL | Status: DC
  Administered 2023-07-03 – 2023-07-04 (×2): 30 mg via ORAL
  Filled 2023-07-02 (×2): qty 3

## 2023-07-02 MED ORDER — WITCH HAZEL-GLYCERIN EX PADS: 1.0000 | MEDICATED_PAD | CUTANEOUS | Status: DC | PRN

## 2023-07-02 MED ORDER — ONDANSETRON HCL 4 MG/2ML IJ SOLN: 4.0000 mg | INTRAMUSCULAR | Status: DC | PRN

## 2023-07-02 MED ORDER — TETANUS-DIPHTH-ACELL PERTUSSIS 5-2.5-18.5 LF-MCG/0.5 IM SUSY: 0.5000 mL | PREFILLED_SYRINGE | Freq: Once | INTRAMUSCULAR | Status: DC

## 2023-07-02 MED ORDER — PHENYLEPHRINE 80 MCG/ML (10ML) SYRINGE FOR IV PUSH (FOR BLOOD PRESSURE SUPPORT): 80.0000 ug | PREFILLED_SYRINGE | INTRAVENOUS | Status: DC | PRN

## 2023-07-02 MED ORDER — BENZOCAINE-MENTHOL 20-0.5 % EX AERO
1.0000 | INHALATION_SPRAY | CUTANEOUS | Status: DC | PRN
  Administered 2023-07-02: 1 via TOPICAL
  Filled 2023-07-02: qty 56

## 2023-07-02 MED ORDER — PANTOPRAZOLE SODIUM 20 MG PO TBEC
20.0000 mg | DELAYED_RELEASE_TABLET | Freq: Every day | ORAL | Status: DC | PRN
  Administered 2023-07-02: 20 mg via ORAL
  Filled 2023-07-02 (×2): qty 1

## 2023-07-02 MED ORDER — IBUPROFEN 600 MG PO TABS
ORAL_TABLET | ORAL | Status: AC
Start: 2023-07-02 — End: 2023-07-02
  Administered 2023-07-02: 600 mg via ORAL
  Filled 2023-07-02: qty 1

## 2023-07-02 MED ORDER — EPHEDRINE 5 MG/ML INJ: 10.0000 mg | INTRAVENOUS | Status: DC | PRN

## 2023-07-02 MED ORDER — ONDANSETRON HCL 4 MG PO TABS: 4.0000 mg | ORAL_TABLET | ORAL | Status: DC | PRN

## 2023-07-02 MED ORDER — SERTRALINE HCL 25 MG PO TABS
150.0000 mg | ORAL_TABLET | Freq: Every day | ORAL | Status: DC
  Administered 2023-07-02 – 2023-07-04 (×3): 150 mg via ORAL
  Filled 2023-07-02 (×2): qty 2
  Filled 2023-07-02: qty 1

## 2023-07-02 MED ORDER — OXYCODONE HCL 5 MG PO TABS
10.0000 mg | ORAL_TABLET | ORAL | Status: DC | PRN
  Administered 2023-07-02 – 2023-07-04 (×6): 10 mg via ORAL
  Filled 2023-07-02 (×6): qty 2

## 2023-07-02 MED ORDER — COCONUT OIL OIL: 1.0000 | TOPICAL_OIL | Status: DC | PRN

## 2023-07-02 MED ORDER — SIMETHICONE 80 MG PO CHEW: 80.0000 mg | CHEWABLE_TABLET | ORAL | Status: DC | PRN

## 2023-07-02 MED ORDER — SODIUM CHLORIDE 0.9 % IV SOLN
INTRAVENOUS | Status: DC | PRN
  Administered 2023-07-02: 4 mL via EPIDURAL
  Administered 2023-07-02: 5 mL via EPIDURAL

## 2023-07-02 MED ORDER — FENTANYL-BUPIVACAINE-NACL 0.5-0.125-0.9 MG/250ML-% EP SOLN
EPIDURAL | Status: AC
  Filled 2023-07-02: qty 250

## 2023-07-02 MED ORDER — LIDOCAINE-EPINEPHRINE (PF) 1.5 %-1:200000 IJ SOLN
INTRAMUSCULAR | Status: DC | PRN
  Administered 2023-07-02: 4 mL via EPIDURAL

## 2023-07-02 MED ORDER — DIPHENHYDRAMINE HCL 25 MG PO CAPS: 25.0000 mg | ORAL_CAPSULE | Freq: Four times a day (QID) | ORAL | Status: DC | PRN

## 2023-07-02 MED ORDER — PRENATAL MULTIVITAMIN CH
1.0000 | ORAL_TABLET | Freq: Every day | ORAL | Status: DC
  Administered 2023-07-02 – 2023-07-04 (×3): 1 via ORAL
  Filled 2023-07-02 (×3): qty 1

## 2023-07-02 MED ORDER — DIBUCAINE (PERIANAL) 1 % EX OINT: 1.0000 | TOPICAL_OINTMENT | CUTANEOUS | Status: DC | PRN

## 2023-07-02 MED ORDER — ACETAMINOPHEN 325 MG PO TABS
650.0000 mg | ORAL_TABLET | ORAL | Status: DC | PRN
  Administered 2023-07-02: 650 mg via ORAL
  Filled 2023-07-02: qty 2

## 2023-07-02 MED ORDER — OXYCODONE HCL 5 MG PO TABS
5.0000 mg | ORAL_TABLET | ORAL | Status: DC | PRN
  Administered 2023-07-02 – 2023-07-03 (×2): 5 mg via ORAL
  Filled 2023-07-02 (×2): qty 1

## 2023-07-02 MED ORDER — LACTATED RINGERS IV SOLN
500.0000 mL | Freq: Once | INTRAVENOUS | Status: AC
  Administered 2023-07-02: 500 mL via INTRAVENOUS

## 2023-07-02 NOTE — Anesthesia Procedure Notes (Addendum)
 Epidural Patient location during procedure: OB End time: 07/02/2023 5:28 AM  Staffing Performed: anesthesiologist   Preanesthetic Checklist Completed: patient identified, IV checked, site marked, risks and benefits discussed, surgical consent, monitors and equipment checked, pre-op evaluation and timeout performed  Epidural Patient position: sitting Prep: Betadine Patient monitoring: heart rate, continuous pulse ox and blood pressure Approach: midline Location: L4-L5 Injection technique: LOR saline  Needle:  Needle type: Tuohy  Needle gauge: 17 G Needle length: 9 cm and 9 Needle insertion depth: 6 cm Catheter type: closed end flexible Catheter size: 19 Gauge Catheter at skin depth: 12 cm Test dose: negative and 1.5% lidocaine with Epi 1:200 K  Assessment Events: blood not aspirated, no cerebrospinal fluid, injection not painful, no injection resistance, no paresthesia and negative IV test  Additional Notes   Patient tolerated the insertion well without complications.Reason for block:procedure for pain

## 2023-07-02 NOTE — Anesthesia Preprocedure Evaluation (Signed)
 Anesthesia Evaluation  Patient identified by MRN, date of birth, ID band Patient awake    Reviewed: Allergy & Precautions, H&P , NPO status , Patient's Chart, lab work & pertinent test results, reviewed documented beta blocker date and time   Airway Mallampati: II  TM Distance: >3 FB Neck ROM: full    Dental no notable dental hx. (+) Teeth Intact   Pulmonary neg pulmonary ROS, former smoker   Pulmonary exam normal breath sounds clear to auscultation       Cardiovascular Exercise Tolerance: Good negative cardio ROS  Rhythm:regular Rate:Normal     Neuro/Psych  Headaches  Anxiety Depression     negative psych ROS   GI/Hepatic Neg liver ROS,GERD  Medicated,,  Endo/Other  negative endocrine ROSdiabetes, Well Controlled    Renal/GU      Musculoskeletal   Abdominal   Peds  Hematology  (+) Blood dyscrasia, anemia   Anesthesia Other Findings   Reproductive/Obstetrics (+) Pregnancy                             Anesthesia Physical Anesthesia Plan  ASA: 2  Anesthesia Plan: Epidural   Post-op Pain Management:    Induction:   PONV Risk Score and Plan:   Airway Management Planned:   Additional Equipment:   Intra-op Plan:   Post-operative Plan:   Informed Consent: I have reviewed the patients History and Physical, chart, labs and discussed the procedure including the risks, benefits and alternatives for the proposed anesthesia with the patient or authorized representative who has indicated his/her understanding and acceptance.       Plan Discussed with:   Anesthesia Plan Comments:        Anesthesia Quick Evaluation

## 2023-07-02 NOTE — Discharge Summary (Signed)
 Postpartum Discharge Summary  Patient Name: Monica Bonilla DOB: October 29, 1980 MRN: [id number]  Date of admission: 07/01/2023 Delivery date:07/02/2023 Delivering provider: Roney Jaffe Date of discharge: 07/04/2023  Primary OB: [facility] [id number] last menstrual period was 10/29/2022 (approximate). EDC Estimated Date of Delivery: 08/05/23 Gestational Age at Delivery: [redacted]w[redacted]d   Admitting diagnosis: Premature rupture of membranes [O42.90] Preterm premature rupture of membranes (PPROM) with unknown onset of labor [O42.919] Intrauterine pregnancy: [redacted]w[redacted]d     Secondary diagnosis:   Principal Problem:   Premature rupture of membranes Active Problems:   Preterm premature rupture of membranes (PPROM) with unknown onset of labor   Discharge Diagnosis: Preterm Pregnancy Delivered      Hospital course: Onset of Labor With Vaginal Delivery      43 y.o. yo [id number] at [redacted]w[redacted]d was admitted for PPROM on 07/01/2023. Labor course was uncomplicated.  Membrane Rupture Time/Date: 2:45 AM,07/01/2023  Delivery Method:Vaginal, Spontaneous Operative Delivery:N/A Episiotomy: None Lacerations:  Labial Patient had an uncomplicated postpartum course.  She is ambulating, tolerating a regular diet, passing flatus, and urinating well. Patient is discharged home in stable condition on 07/04/23.  Newborn Data: Birth date:07/02/2023 Birth time:7:29 AM Gender:Female Living status:Living Apgars:2 ,8  Weight:2590 g                                            Post partum procedures: none Augmentation: Pitocin Complications: None Delivery Type: spontaneous vaginal delivery Anesthesia: epidural anesthesia Placenta: manual removal To Pathology: No   Prenatal Labs:  Blood type/Rh A Positive   Antibody screen Negative    Rubella Immune     Varicella Immune   RPR Non reactive     HBsAg Negative    Hep C NR   HIV Negative     GC neg  Chlamydia neg  Genetic screening Declined   1 hour GTT 148   3 hour GTT 83, 166, 140, 46  GBS POSITIVE/-- (02/17 0906)      Magnesium Sulfate received: No BMZ received: No Rhophylac:was not indicated MMR: was not indicated Varivax vaccine given: was not indicated - Tdap vaccine: Given prenatally on 05/06/2023  - Flu vaccine: Declined  -RSV vaccine: Not given   Transfusion:No  Physical exam  Vitals:   07/03/23 0745 07/03/23 1544 07/04/23 0050 07/04/23 0810  BP: 108/81 116/75 121/78 105/60  Pulse: 84 77 81 78  Resp: 18 18 17 18   Temp: 97.8 F (36.6 C) 98.6 F (37 C) 98 F (36.7 C) 98.2 F (36.8 C)  TempSrc: Oral  Oral   SpO2: 99% 98% 99% 98%  Weight:      Height:       General: alert, cooperative, and no distress Lochia: appropriate Uterine Fundus: firm Perineum:minimal edema/laceration hemostatic DVT Evaluation: No evidence of DVT seen on physical exam. Negative Homan's sign. No cords or calf tenderness. No significant calf/ankle edema.  Labs: Lab Results  Component Value Date   WBC 10.4 07/03/2023   HGB 10.1 (L) 07/03/2023   HCT 30.4 (L) 07/03/2023   MCV 84.4 07/03/2023   PLT 235 07/03/2023      Latest Ref Rng & Units 04/30/2023    3:50 PM  CMP  Glucose 70 - 99 mg/dL 90   BUN 6 - 20 mg/dL 10   Creatinine 6.29 - 1.00 mg/dL 5.28   Sodium 413 - 244 mmol/L 135   Potassium 3.5 -  5.1 mmol/L 3.5   Chloride 98 - 111 mmol/L 103   CO2 22 - 32 mmol/L 22   Calcium 8.9 - 10.3 mg/dL 8.3   Total Protein 6.5 - 8.1 g/dL 6.6   Total Bilirubin <1.6 mg/dL 0.4   Alkaline Phos 38 - 126 U/L 83   AST 15 - 41 U/L 16   ALT 0 - 44 U/L 19    Edinburgh Score:    12/26/2022   10:59 AM  Edinburgh Postnatal Depression Scale Screening Tool  I have been able to laugh and see the funny side of things. 0  I have looked forward with enjoyment to things. 3  I have blamed myself unnecessarily when things went wrong. 2  I have been anxious or worried for no good reason. 2  I have felt scared or panicky for no good reason. 0  Things have  been getting on top of me. 2  I have been so unhappy that I have had difficulty sleeping. 0  I have felt sad or miserable. 2  I have been so unhappy that I have been crying. 2  The thought of harming myself has occurred to me. 0  Edinburgh Postnatal Depression Scale Total 13    Risk assessment for postpartum VTE and prophylactic treatment: Very high risk factors: None High risk factors: None Moderate risk factors: BMI 30-40 kg/m2  Postpartum VTE prophylaxis with LMWH not indicated  After visit meds:  Allergies as of 07/04/2023       Reactions   Buspar [buspirone]    anxiety   Venlafaxine    agitation, tachycardia        Medication List     TAKE these medications    acetaminophen 325 MG tablet Commonly known as: Tylenol Take 2 tablets (650 mg total) by mouth every 4 (four) hours as needed (for pain scale < 4).   amphetamine-dextroamphetamine 30 MG tablet Commonly known as: Adderall Take 1 tablet by mouth 2 (two) times daily.   benzocaine-Menthol 20-0.5 % Aero Commonly known as: DERMOPLAST Apply 1 Application topically as needed for irritation (perineal discomfort).   clonazePAM 0.5 MG tablet Commonly known as: KLONOPIN TAKE 1 TABLET BY MOUTH EVERY DAY AS NEEDED FOR ANXIETY   coconut oil Oil Apply 1 Application topically as needed.   dibucaine 1 % Oint Commonly known as: NUPERCAINAL Place 1 Application rectally as needed for hemorrhoids.   ferrous sulfate 325 (65 FE) MG tablet Take 1 tablet (325 mg total) by mouth 2 (two) times daily with a meal.   ibuprofen 600 MG tablet Commonly known as: ADVIL Take 1 tablet (600 mg total) by mouth every 6 (six) hours.   pantoprazole 40 MG tablet Commonly known as: Protonix Take 1 tablet (40 mg total) by mouth daily.   prenatal multivitamin Tabs tablet Take 1 tablet by mouth daily at 12 noon.   senna-docusate 8.6-50 MG tablet Commonly known as: Senokot-S Take 2 tablets by mouth daily. Start taking on: July 05, 2023   sertraline 100 MG tablet Commonly known as: ZOLOFT Take 150 mg by mouth daily.   simethicone 80 MG chewable tablet Commonly known as: MYLICON Chew 1 tablet (80 mg total) by mouth as needed for flatulence.   witch hazel-glycerin pad Commonly known as: TUCKS Apply 1 Application topically as needed for hemorrhoids.   zolpidem 10 MG tablet Commonly known as: AMBIEN Take 1 tablet (10 mg total) by mouth at bedtime as needed for sleep.  Discharge home in stable condition Infant Feeding: Bottle, Breast, and pumping Infant Disposition:NICU Discharge instruction: per After Visit Summary and Postpartum booklet. Activity: Advance as tolerated. Pelvic rest for 6 weeks.  Diet: routine diet Anticipated Birth Control:  Undecided  Postpartum Appointment:6 weeks Additional Postpartum F/U:  2 week mood check  Future Appointments:No future appointments. Follow up Visit:  Follow-up Information     Liboon, Jazmine, CNM Follow up in 6 week(s).   Specialty: Certified Nurse Midwife Why: postpartum visit Contact information: [address] [place] [address] [phone]         Roney Jaffe, CNM Follow up in 2 week(s).   Specialty: Certified Nurse Midwife Why: mood Check Contact information: [address] [place] [address] [phone]                 Plan:  Monica Bonilla was discharged to home in good condition. Follow-up appointment as directed.    Signed: Chari Manning CNM

## 2023-07-02 NOTE — Progress Notes (Signed)
 L&D Note    Subjective:  Patient resting.   Objective:   Vitals:   07/01/23 2003 07/01/23 2307 07/02/23 0131 07/02/23 0259  BP: 109/68  98/64   Pulse: 86  71   Resp: 14  14   Temp: 98.8 F (37.1 C) 97.8 F (36.6 C) 98.3 F (36.8 C) 98.3 F (36.8 C)  TempSrc: Oral Oral Oral Oral  Weight:      Height:        Current Vital Signs 24h Vital Sign Ranges  T 98.3 F (36.8 C) Temp  Avg: 98.1 F (36.7 C)  Min: 97.7 F (36.5 C)  Max: 98.8 F (37.1 C)  BP 98/64 BP  Min: 98/64  Max: 126/82  HR 71 Pulse  Avg: 81  Min: 71  Max: 89  RR 14 Resp  Avg: 15.5  Min: 14  Max: 18  SaO2     No data recorded      Gen: no distress FHR: Baseline: 140 bpm, Variability: moderate, Accels: Present, Decels: none Toco: regular, every 1-2.5 minutes SVE: Deferred    Medications SCHEDULED MEDICATIONS   oxytocin 40 units in LR 1000 mL  333 mL Intravenous Once    MEDICATION INFUSIONS   lactated ringers     lactated ringers 125 mL/hr at 07/01/23 1808   oxytocin 18 milli-units/min (07/02/23 0259)   oxytocin     pencillin G potassium IV 3 Million Units (07/02/23 0049)    PRN MEDICATIONS  acetaminophen, fentaNYL (SUBLIMAZE) injection, lactated ringers, lidocaine (PF), ondansetron, pantoprazole, sodium citrate-citric acid, terbutaline   Assessment & Plan:  43 y.o. G2P1001 at [redacted]w[redacted]d admitted for preterm premature rupture of membranes.  -Labor: SVE deferred d/t patient request to rest. IV Pitocin currently infusing @ 18.   Contractions q 1-2.5 mins. MVU's inadequate (range 150-165).  -Fetal Well-being: Category I -GBS: positive  - Received Penicillin x 3  -Membranes: SROM @ 0245 on 02/17, per patient  -Augmentation: Continue to increase IV Pitocin, Intervention: encourage maternal    position changes as indicated.  -Analgesia: regional anesthesia per request of patient   Roney Jaffe, CNM  07/02/2023 3:33 AM  [facility]

## 2023-07-02 NOTE — Progress Notes (Signed)
 L&D Note    Subjective:  Patient state she is fatigue and restless. She feels that "the contractions are picking up and have become stronger."  Objective:   Vitals:   07/01/23 1246 07/01/23 1444 07/01/23 1700 07/01/23 2003  BP: 103/75 108/71  109/68  Pulse: 82 77  86  Resp:  18  14  Temp: 97.7 F (36.5 C) 97.7 F (36.5 C) 98.6 F (37 C) 98.8 F (37.1 C)  TempSrc: Oral Oral Oral Oral  Weight:      Height:        Current Vital Signs 24h Vital Sign Ranges  T 98.8 F (37.1 C) Temp  Avg: 98.1 F (36.7 C)  Min: 97.7 F (36.5 C)  Max: 98.8 F (37.1 C)  BP 109/68 BP  Min: 103/75  Max: 126/82  HR 86 Pulse  Avg: 83.5  Min: 77  Max: 89  RR 14 Resp  Avg: 16  Min: 14  Max: 18  SaO2     No data recorded      Gen: alert, fatigued, cooperative, no distress FHR: Baseline: 145 bpm, Variability: moderate, Accels: Abscent, Decels: none Toco: regular, every 1-2.5 minutes SVE: Dilation: 4.5 Effacement (%): 50 Cervical Position: Middle Station: -1 Presentation: Vertex Exam by:: Mashawn Brazil, CNM  Medications SCHEDULED MEDICATIONS   ammonia       lidocaine (PF)       misoprostol       oxytocin       oxytocin 40 units in LR 1000 mL  333 mL Intravenous Once    MEDICATION INFUSIONS   lactated ringers     lactated ringers 125 mL/hr at 07/01/23 1808   oxytocin 14 milli-units/min (07/01/23 2148)   oxytocin     pencillin G potassium IV 3 Million Units (07/01/23 2004)    PRN MEDICATIONS  acetaminophen, ammonia, fentaNYL (SUBLIMAZE) injection, lactated ringers, lidocaine (PF), lidocaine (PF), misoprostol, ondansetron, oxytocin, pantoprazole, sodium citrate-citric acid, terbutaline   Assessment & Plan:  43 y.o. G2P1001 at [redacted]w[redacted]d admitted for preterm premature rupture of membranes.  -Labor: Satisfactory labor progress. IV Pitocin currently infusing @ 14. IUPC placed approximately @ 2140.   -Fetal Well-being: Category I -GBS: positive  - Received Penicillin x 2  -Membranes: SROM @ 0245 on  02/17, per patient  - Augmentation: Continue to increase IV Pitocin and encourage maternal position    changes as indicated.  -Analgesia: IVPM and regional anesthesia per request of patient    Roney Jaffe, CNM  07/02/2023 12:14 AM  [facility]

## 2023-07-02 NOTE — Lactation Note (Addendum)
 This note was copied from a baby's chart. Lactation Consultation Note  Patient Name: Monica Bonilla Date: 07/02/2023 Age:41 hours Reason for consult: Initial assessment;Late-preterm 34-36.6wks;Breastfeeding assistance;RN request;Mother's request   Maternal Data This is mom's 2nd baby, SVD. Mom with history of depression, PTSD, ADD ,GAD, anemia, and AMA. Mom with history of positive cocaine and amphetamine UDS on 04/30/23. On admission UDS is negative.  On initial visit discussed and reviewed with mom and Aunt who was at bedside the LPT recommendations of "My Care Plan"(LPT feeding protocol). Per mom she previously had a term baby and she is unaware of the specific concerns for late preterm infants. Provided and reviewed with mom line by line the LPT informational handouts and feeding plan. Mom verbalized understanding once educated and is in agreement with plan.  Has patient been taught Hand Expression?: Yes Does the patient have breastfeeding experience prior to this delivery?: Yes How long did the patient breastfeed?: Mom previous child is 1 years old, per mom she breastfed for 6 months and added in formula when she returned to work. She went on to do some breastfeeding until baby was 35 months old.   Feeding Mother's Current Feeding Choice: Breast Milk Nipple Type: Slow - flow  Assisted mom X2 with breastfeeding. Breastfeeding session was time limited to 10 minutes. Provided mom tips and strategies to maximize position and latch techniques to accommodate LPT infant. After each breastfeeding attempt mom pumped and provided any colostrum expressed and also supplemented baby with additional formula. Instructed mom on paced bottle feeding. First feed baby without spillage using slow flow nipple. 2nd feed baby as he became sleepier had spillage past the nipple. Recommended mom use the Dr.Brown's bottle at the next feeding.Mom's goal is once her milk volume increases she prefers to  use her milk. She will use formula if needed.  LATCH Score Latch: Repeated attempts needed to sustain latch, nipple held in mouth throughout feeding, stimulation needed to elicit sucking reflex.  Audible Swallowing: None  Type of Nipple: Everted at rest and after stimulation  Comfort (Breast/Nipple): Soft / non-tender  Hold (Positioning): Assistance needed to correctly position infant at breast and maintain latch.  LATCH Score: 6   Lactation Tools Discussed/Used Tools: Pump Breast pump type: Double-Electric Breast Pump Pump Education: Setup, frequency, and cleaning;Milk Storage Reason for Pumping: Baby is  35 1/7 weeks with inconsistent breastfeeding behaviors Pumping frequency: Pump after each breastfeeder Pumped volume: 2 mL  Interventions Interventions: Pace feeding;Education;DEBP;Position options;Expressed milk;Breast compression;Adjust position;Support pillows;Hand express;Breast massage;Assisted with latch;LPT handout/interventions Discussed with mom characteristics of late preterm infant and breastfeeding behaviors with potential for inconsistent breastfeeding until baby's original due date(08-02-2023) Reviewed LPT feeding plan. "My Plan" on crib as well as cory provided to mom. Recommended mom at the next feeding Breastfeed, offer supplemental feed, and pump. The last 2 feeds mom pumped after breastfeeding to be able to offer her milk first. However the duration of time baby is waiting to receive supplement milk is too long and it is recommended mom offer supplemental milk after breastfeeding/breastfeeding attempt, and then pump. She can save that colostrum for the next feeding. Discharge Pump: Advised to call insurance company;[employer]  Consult Status Consult Status: Follow-up Date: 07/02/23 Follow-up type: In-patient  Update provided to care nurse.  Fuller Song 07/02/2023, 4:29 PM

## 2023-07-03 LAB — CBC
HCT: 30.4 % — ABNORMAL LOW (ref 36.0–46.0)
Hemoglobin: 10.1 g/dL — ABNORMAL LOW (ref 12.0–15.0)
MCH: 28.1 pg (ref 26.0–34.0)
MCHC: 33.2 g/dL (ref 30.0–36.0)
MCV: 84.4 fL (ref 80.0–100.0)
Platelets: 235 10*3/uL (ref 150–400)
RBC: 3.6 MIL/uL — ABNORMAL LOW (ref 3.87–5.11)
RDW: 15.1 % (ref 11.5–15.5)
WBC: 10.4 10*3/uL (ref 4.0–10.5)
nRBC: 0 % (ref 0.0–0.2)

## 2023-07-03 MED ORDER — IBUPROFEN 600 MG PO TABS
600.0000 mg | ORAL_TABLET | Freq: Four times a day (QID) | ORAL | Status: DC
Start: 2023-07-04 — End: 2023-07-04
  Administered 2023-07-04 (×3): 600 mg via ORAL
  Filled 2023-07-03 (×4): qty 1

## 2023-07-03 NOTE — Lactation Note (Signed)
 This note was copied from a baby's chart. Lactation Consultation Note  Patient Name: Monica Bonilla ZOXWR'U Date: 07/03/2023 Age:41 hours  Reason for consult: Initial assessment;Late-preterm 34-36.6wks;Breastfeeding assistance;RN reques    Maternal Data  This is mom's 2nd baby, SVD. Mom with history of depression, PTSD, ADD ,GAD, anemia, and AMA. Mom with history of positive cocaine and amphetamine UDS on 04/30/23. On admission UDS is negative.   On follow-up today mom reports she is very tired and hasn't pumped since last evening.  Feeding Mother's Current Feeding Choice: Breast Milk and Formula Nipple Type: Dr. Lorne Skeens Reviewed with mom LPT feeding plan. Recommended mom consider to optimize feeding and pumping within the recommended times frames and to optimize mom's sleep to select some feedings where the support person bottle feeds the baby and mom pumps. Mom may want to consider doing so especially for night time feeds. Discussed the baby may be inconsistent at breastfeeding until he gets closer to the original due date.  Lactation Tools Discussed/Used  Discussed importance of pumping consistently to optimze milk production. Mom was too tired last night and has not pumped since last evening.   Interventions  Education: Reviewed LPT plan and discussed importance of keeping total feed time in no more than 30-40 minutes. Reviewed  with mom plan of offering baby to eat every 3 hours and may offer sooner with feeding cues.   Discharge  Information requested by mom and mom's sister Stanton Kidney for an order from mom's OB provider for mom to obtain breastpump. Information for insurance and Medical [employer] Store was provided to and faxed by Nurse Midwife. Copy of faxed information and confirmation brought to patients room.  Consult Status  Follow-up Inpatient 07/04/23  Update provided to care nurse.  Fuller Song 07/03/2023, 5:42 PM

## 2023-07-03 NOTE — Discharge Instructions (Signed)
 Vaginal Delivery, Care After Refer to this sheet in the next few weeks. These discharge instructions provide you with information on caring for yourself after delivery. Your caregiver may also give you specific instructions. Your treatment has been planned according to the most current medical practices available, but problems sometimes occur. Call your caregiver if you have any problems or questions after you go home. HOME CARE INSTRUCTIONS Take over-the-counter or prescription medicines only as directed by your caregiver or pharmacist. Do not drink alcohol, especially if you are breastfeeding or taking medicine to relieve pain. Do not smoke tobacco. Continue to use good perineal care. Good perineal care includes: Wiping your perineum from back to front Keeping your perineum clean. You can do sitz baths twice a day, to help keep this area clean Do not use tampons, douche or have sex until your caregiver says it is okay. Shower only and avoid sitting in submerged water, aside from sitz baths Wear a well-fitting bra that provides breast support. Eat healthy foods. Drink enough fluids to keep your urine clear or pale yellow. Eat high-fiber foods such as whole grain cereals and breads, brown rice, beans, and fresh fruits and vegetables every day. These foods may help prevent or relieve constipation. Avoid constipation with high fiber foods or medications, such as miralax or metamucil Follow your caregiver's recommendations regarding resumption of activities such as climbing stairs, driving, lifting, exercising, or traveling. Talk to your caregiver about resuming sexual activities. Resumption of sexual activities is dependent upon your risk of infection, your rate of healing, and your comfort and desire to resume sexual activity. Try to have someone help you with your household activities and your newborn for at least a few days after you leave the hospital. Rest as much as possible. Try to rest or  take a nap when your newborn is sleeping. Increase your activities gradually. Keep all of your scheduled postpartum appointments. It is very important to keep your scheduled follow-up appointments. At these appointments, your caregiver will be checking to make sure that you are healing physically and emotionally. SEEK MEDICAL CARE IF:  You are passing large clots from your vagina. Save any clots to show your caregiver. You have a foul smelling discharge from your vagina. You have trouble urinating. You are urinating frequently. You have pain when you urinate. You have a change in your bowel movements. You have increasing redness, pain, or swelling near your vaginal incision (episiotomy) or vaginal tear. You have pus draining from your episiotomy or vaginal tear. Your episiotomy or vaginal tear is separating. You have painful, hard, or reddened breasts. You have a severe headache. You have blurred vision or see spots. You feel sad or depressed. You have thoughts of hurting yourself or your newborn. You have questions about your care, the care of your newborn, or medicines. You are dizzy or light-headed. You have a rash. You have nausea or vomiting. You were breastfeeding and have not had a menstrual period within 12 weeks after you stopped breastfeeding. You are not breastfeeding and have not had a menstrual period by the 12th week after delivery. You have a fever. SEEK IMMEDIATE MEDICAL CARE IF:  You have persistent pain. You have chest pain. You have shortness of breath. You faint. You have leg pain. You have stomach pain. Your vaginal bleeding saturates two or more sanitary pads in 1 hour. MAKE SURE YOU:  Understand these instructions. Will watch your condition. Will get help right away if you are not doing well or  get worse. Document Released: 04/27/2000 Document Revised: 09/14/2013 Document Reviewed: 12/26/2011 [facility] Patient Information 2015 [place], [place]. This  information is not intended to replace advice given to you by your health care provider. Make sure you discuss any questions you have with your health care provider.  Sitz Bath A sitz bath is a warm water bath taken in the sitting position. The water covers only the hips and butt (buttocks). We recommend using one that fits in the toilet, to help with ease of use and cleanliness. It may be used for either healing or cleaning purposes. Sitz baths are also used to relieve pain, itching, or muscle tightening (spasms). The water may contain medicine. Moist heat will help you heal and relax.  HOME CARE  Take 3 to 4 sitz baths a day. Fill the bathtub half-full with warm water. Sit in the water and open the drain a little. Turn on the warm water to keep the tub half-full. Keep the water running constantly. Soak in the water for 15 to 20 minutes. After the sitz bath, pat the affected area dry. GET HELP RIGHT AWAY IF: You get worse instead of better. Stop the sitz baths if you get worse. MAKE SURE YOU: Understand these instructions. Will watch your condition. Will get help right away if you are not doing well or get worse. Document Released: 06/07/2004 Document Revised: 01/23/2012 Document Reviewed: 08/28/2010 [facility] Patient Information 2015 [place], [place]. This information is not intended to replace advice given to you by your health care provider. Make sure you discuss any questions you have with your health care provider.

## 2023-07-03 NOTE — Anesthesia Postprocedure Evaluation (Signed)
 Anesthesia Post Note  Patient: Monica Bonilla  Procedure(s) Performed: AN AD HOC LABOR EPIDURAL  Patient location during evaluation: Mother Baby Anesthesia Type: Epidural Level of consciousness: awake and alert Pain management: pain level controlled Vital Signs Assessment: post-procedure vital signs reviewed and stable Respiratory status: spontaneous breathing, nonlabored ventilation and respiratory function stable Cardiovascular status: stable Postop Assessment: no headache, no backache and epidural receding Anesthetic complications: no  No notable events documented.   Last Vitals:  Vitals:   07/02/23 2330 07/03/23 0745  BP: 121/65 108/81  Pulse: 84 84  Resp: 18 18  Temp:  36.6 C  SpO2: 98% 99%    Last Pain:  Vitals:   07/03/23 0745  TempSrc: Oral  PainSc:                  Elmarie Mainland

## 2023-07-03 NOTE — Progress Notes (Signed)
 Postpartum Day  1  Subjective: 43 y.o. [id number] postpartum day #1 status post normal spontaneous vaginal delivery. She is ambulating, is tolerating po, is voiding spontaneously.  Her pain is well controlled on PO pain medications. Her lochia is less than menses.  Objective: BP 108/81 (BP Location: Right Arm)   Pulse 84   Temp 97.8 F (36.6 C) (Oral)   Resp 18   Ht 5\' 6"  (1.676 m)   Wt 88 kg   LMP 10/29/2022 (Approximate)   SpO2 99%   Breastfeeding Unknown   BMI 31.31 kg/m    Physical Exam:  General: alert, cooperative, and no distress Breasts: soft/nontender Pulm: nl effort Abdomen: soft, non-tender, active bowel sounds Uterine Fundus: firm Perineum: minimal edema, intact Lochia: appropriate DVT Evaluation: No evidence of DVT seen on physical exam.  Recent Labs    07/01/23 1038 07/03/23 0637  HGB 11.8* 10.1*  HCT 34.8* 30.4*  WBC 10.6* 10.4  PLT 303 235    Assessment/Plan: 42 y.o. G2P1102 postpartum day # 1  1. Continue routine postpartum care -TOC consult placed for history of cocaine use in pregnancy -UDS on admission negative and UDS screen for baby negative   2. Infant feeding status: breast and formula feeding -Lactation consult PRN for breastfeeding   3. Contraception plan:  undecided   4. Acute blood loss anemia - clinically significant.  -Hemodynamically stable and asymptomatic -Intervention: start on oral supplementation with ferrous sulfate 325 mg   5. Immunization status:   all immunizations up to date  Disposition: continue inpatient postpartum care    LOS: 2 days   Gustavo Lah, Ina Homes 07/03/2023, 9:15 AM   ----- Margaretmary Eddy  Certified Nurse Midwife [facility]

## 2023-07-04 ENCOUNTER — Ambulatory Visit: Payer: Self-pay

## 2023-07-04 LAB — SURGICAL PATHOLOGY

## 2023-07-04 MED ORDER — SENNOSIDES-DOCUSATE SODIUM 8.6-50 MG PO TABS: 2.0000 | ORAL_TABLET | Freq: Every day | ORAL | Status: DC

## 2023-07-04 MED ORDER — ACETAMINOPHEN 325 MG PO TABS
650.0000 mg | ORAL_TABLET | ORAL | Status: AC | PRN
Start: 2023-07-04 — End: ?

## 2023-07-04 MED ORDER — FERROUS SULFATE 325 (65 FE) MG PO TABS: 325.0000 mg | ORAL_TABLET | Freq: Two times a day (BID) | ORAL | Status: DC

## 2023-07-04 MED ORDER — DIBUCAINE (PERIANAL) 1 % EX OINT: 1.0000 | TOPICAL_OINTMENT | CUTANEOUS | Status: DC | PRN

## 2023-07-04 MED ORDER — WITCH HAZEL-GLYCERIN EX PADS: 1.0000 | MEDICATED_PAD | CUTANEOUS | Status: DC | PRN

## 2023-07-04 MED ORDER — COCONUT OIL OIL
1.0000 | TOPICAL_OIL | Status: AC | PRN
Start: 2023-07-04 — End: ?

## 2023-07-04 MED ORDER — BENZOCAINE-MENTHOL 20-0.5 % EX AERO: 1.0000 | INHALATION_SPRAY | CUTANEOUS | Status: DC | PRN

## 2023-07-04 MED ORDER — PRENATAL MULTIVITAMIN CH: 1.0000 | ORAL_TABLET | Freq: Every day | ORAL | Status: AC

## 2023-07-04 MED ORDER — SIMETHICONE 80 MG PO CHEW: 80.0000 mg | CHEWABLE_TABLET | ORAL | Status: DC | PRN

## 2023-07-04 MED ORDER — IBUPROFEN 600 MG PO TABS: 600.0000 mg | ORAL_TABLET | Freq: Four times a day (QID) | ORAL | 0 refills | Status: DC

## 2023-07-04 NOTE — Lactation Note (Signed)
 This note was copied from a baby's chart. Lactation Consultation Note  Patient Name: Monica Bonilla [id number]'[id number] Date: 07/04/2023 Age:41 hours Reason for consult: Follow-up assessment;Late-preterm 34-36.6wks;Infant < 6lbs   Maternal Data This is mom's 2nd baby, SVD. Mom with history of depression, PTSD, ADD ,GAD, anemia, and AMA. Mom with history of positive cocaine and amphetamine UDS on 04/30/23. On admission UDS is negative.   On follow-up today mom reports baby is completing feeds in no more than 30 minutes. She time limits breastfeeding, then baby is offered bottle via Dr.Brown's bottles, and mom pumps. Per mom baby is taking larger amounts when bottle feeding. Has patient been taught Hand Expression?: Yes Does the patient have breastfeeding experience prior to this delivery?: Yes How long did the patient breastfeed?: Mom's previous child is 4 years old, per mom she breastfed first baby for 6 months and then continued to breastfeed for 18 months.  Feeding Mother's Current Feeding Choice: Breast Milk (Mom's plan is to transition baby to breastmilk once her milk volume increases. For now she is bridging with formula.)  Discussed with mom as baby is feeding in no more than 30 minutes baby will be less likely to tire out and not complete a full feeding. Yesterday baby's feeding were taking 50-60 minutes. After review of LPT infant feeding plan mom adjusted time spent at the breast at each feeding time and baby's volumes have improved. Recommended mom continue with current feeding plan to optimize baby's oral intake and mom optimize establishing her milk supply.  Interventions Interventions: Education Reviewed breastmilk storage guidelines.  Discharge Discharge Education: Engorgement and breast care;Warning signs for feeding baby;Outpatient recommendation Pump:  (Mom awaiting Medela double electric via inurance company. Will be delivered to mom. Mom has also been shown use of manual  breastpump.)  Consult Status Consult Status: PRN Date: 07/05/23 Follow-up type: In-patient  Update provided to care nurse.  Fuller Song 07/04/2023, 4:01 PM

## 2023-07-04 NOTE — Progress Notes (Signed)
 Patient discharged home with family. Discharge instructions, when to follow up, and prescriptions reviewed with patient. Patient verbalized understanding. Patient will be escorted out by auxiliary.

## 2023-10-04 DIAGNOSIS — F531 Puerperal psychosis: Secondary | ICD-10-CM | POA: Insufficient documentation

## 2023-10-18 ENCOUNTER — Inpatient Hospital Stay: Admitting: Family Medicine

## 2023-10-28 ENCOUNTER — Encounter: Payer: Self-pay | Admitting: Family Medicine

## 2023-10-28 ENCOUNTER — Ambulatory Visit (INDEPENDENT_AMBULATORY_CARE_PROVIDER_SITE_OTHER): Admitting: Family Medicine

## 2023-10-28 VITALS — BP 106/64 | HR 98 | Resp 16 | Ht 66.0 in | Wt 195.7 lb

## 2023-10-28 DIAGNOSIS — R601 Generalized edema: Secondary | ICD-10-CM

## 2023-10-28 DIAGNOSIS — F531 Puerperal psychosis: Secondary | ICD-10-CM | POA: Diagnosis not present

## 2023-10-28 DIAGNOSIS — R0602 Shortness of breath: Secondary | ICD-10-CM

## 2023-10-28 DIAGNOSIS — Z09 Encounter for follow-up examination after completed treatment for conditions other than malignant neoplasm: Secondary | ICD-10-CM

## 2023-10-28 DIAGNOSIS — Z79899 Other long term (current) drug therapy: Secondary | ICD-10-CM | POA: Diagnosis not present
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
## 2023-10-30 ENCOUNTER — Ambulatory Visit: Payer: Self-pay | Admitting: Family Medicine

## 2023-10-30 ENCOUNTER — Other Ambulatory Visit: Payer: Self-pay | Admitting: Family Medicine

## 2023-10-30 DIAGNOSIS — D72829 Elevated white blood cell count, unspecified: Secondary | ICD-10-CM

## 2023-10-30 DIAGNOSIS — R7989 Other specified abnormal findings of blood chemistry: Secondary | ICD-10-CM

## 2023-11-01 LAB — COMPREHENSIVE METABOLIC PANEL WITH GFR
AG Ratio: 1.4 (calc) (ref 1.0–2.5)
ALT: 12 U/L (ref 6–29)
AST: 12 U/L (ref 10–30)
Albumin: 4.3 g/dL (ref 3.6–5.1)
Alkaline phosphatase (APISO): 92 U/L (ref 31–125)
BUN: 13 mg/dL (ref 7–25)
CO2: 24 mmol/L (ref 20–32)
Calcium: 9.4 mg/dL (ref 8.6–10.2)
Chloride: 105 mmol/L (ref 98–110)
Creat: 0.71 mg/dL (ref 0.50–0.99)
Globulin: 3 g/dL (ref 1.9–3.7)
Glucose, Bld: 84 mg/dL (ref 65–99)
Potassium: 3.9 mmol/L (ref 3.5–5.3)
Sodium: 139 mmol/L (ref 135–146)
Total Bilirubin: 0.4 mg/dL (ref 0.2–1.2)
Total Protein: 7.3 g/dL (ref 6.1–8.1)
eGFR: 110 mL/min/{1.73_m2} (ref >=60)

## 2023-11-01 LAB — CBC WITH DIFFERENTIAL/PLATELET
Absolute Lymphocytes: 2100 {cells}/uL (ref 850–3900)
Absolute Monocytes: 850 {cells}/uL (ref 200–950)
Basophils Absolute: 153 {cells}/uL (ref 0–200)
Basophils Relative: 1.3 %
Eosinophils Absolute: 342 {cells}/uL (ref 15–500)
Eosinophils Relative: 2.9 %
HCT: 38.7 % (ref 35.0–45.0)
Hemoglobin: 12.6 g/dL (ref 11.7–15.5)
MCH: 27.7 pg (ref 27.0–33.0)
MCHC: 32.6 g/dL (ref 32.0–36.0)
MCV: 85.1 fL (ref 80.0–100.0)
MPV: 9.8 fL (ref 7.5–12.5)
Monocytes Relative: 7.2 %
Neutro Abs: 8354 {cells}/uL — ABNORMAL HIGH (ref 1500–7800)
Neutrophils Relative %: 70.8 %
Platelets: 438 10*3/uL — ABNORMAL HIGH (ref 140–400)
RBC: 4.55 10*6/uL (ref 3.80–5.10)
RDW: 13.6 % (ref 11.0–15.0)
Total Lymphocyte: 17.8 %
WBC: 11.8 10*3/uL — ABNORMAL HIGH (ref 3.8–10.8)

## 2023-11-01 LAB — TEST AUTHORIZATION

## 2023-11-01 LAB — TSH: TSH: 4.9 m[IU]/L — ABNORMAL HIGH

## 2023-11-01 LAB — T4, FREE: Free T4: 1.1 ng/dL (ref 0.8–1.8)

## 2023-11-01 LAB — BRAIN NATRIURETIC PEPTIDE: Brain Natriuretic Peptide: <4 pg/mL (ref <100)

## 2023-11-14 ENCOUNTER — Ambulatory Visit: Payer: Self-pay

## 2023-11-14 NOTE — Telephone Encounter (Signed)
 FYI Only or Action Required?: Action required by provider: clinical question for provider.  Patient was last seen in primary care on 10/28/2023 by Glenard Mire, MD. Called Nurse Triage reporting Advice Only. Symptoms began N/A. Interventions attempted: Other: N/A. Symptoms are: N/A.  Triage Disposition: Call PCP When Office is Open  Patient/caregiver understands and will follow disposition?: Yes                              Reason for Disposition  [1] Caller requesting NON-URGENT health information AND [2] PCP's office is the best resource  Answer Assessment - Initial Assessment Questions 1. REASON FOR CALL or QUESTION: What is your reason for calling today? or How can I best help you? or What question do you have that I can help answer?     Patient calling in because the office has been trying to contact her regarding recent lab results. Patient saw result note about elevated TSH. Patient would like provider's input on if this could be caused by the medications she's taking- lithium carbonate or Zyprexa. Patient is also asking if she still needs to come in and have her T4 levels drawn. Please advise at [phone].  Protocols used: Information Only Call - No Triage-A-AH

## 2023-11-14 NOTE — Telephone Encounter (Signed)
 Pt disconnected prior to transfer.     Copied from CRM [id number]. Topic: Clinical - Lab/Test Results >> Nov 14, 2023  2:15 PM Rosaria E wrote: Reason for CRM: Pt has questions regarding her labs

## 2023-11-19 ENCOUNTER — Telehealth: Payer: Self-pay

## 2023-11-19 ENCOUNTER — Other Ambulatory Visit: Payer: Self-pay | Admitting: Family Medicine

## 2023-11-19 DIAGNOSIS — Z5181 Encounter for therapeutic drug level monitoring: Secondary | ICD-10-CM

## 2023-11-19 NOTE — Telephone Encounter (Signed)
 Copied from CRM [id number]. Topic: Clinical - Request for Lab/Test Order >> Nov 19, 2023 12:29 PM Carlatta H wrote: Reason for CRM: Patient would also like to have her lithium  checked in her labs order for tomorrows appointment

## 2023-11-20 ENCOUNTER — Other Ambulatory Visit

## 2023-11-21 ENCOUNTER — Ambulatory Visit: Payer: Self-pay | Admitting: Family Medicine

## 2023-11-21 LAB — CBC WITH DIFFERENTIAL/PLATELET
Absolute Lymphocytes: 2153 {cells}/uL (ref 850–3900)
Absolute Monocytes: 725 {cells}/uL (ref 200–950)
Basophils Absolute: 105 {cells}/uL (ref 0–200)
Basophils Relative: 0.9 %
Eosinophils Absolute: 304 {cells}/uL (ref 15–500)
Eosinophils Relative: 2.6 %
HCT: 37 % (ref 35.0–45.0)
Hemoglobin: 11.5 g/dL — ABNORMAL LOW (ref 11.7–15.5)
MCH: 27.4 pg (ref 27.0–33.0)
MCHC: 31.1 g/dL — ABNORMAL LOW (ref 32.0–36.0)
MCV: 88.3 fL (ref 80.0–100.0)
MPV: 10 fL (ref 7.5–12.5)
Monocytes Relative: 6.2 %
Neutro Abs: 8412 {cells}/uL — ABNORMAL HIGH (ref 1500–7800)
Neutrophils Relative %: 71.9 %
Platelets: 404 Thousand/uL — ABNORMAL HIGH (ref 140–400)
RBC: 4.19 Million/uL (ref 3.80–5.10)
RDW: 13.6 % (ref 11.0–15.0)
Total Lymphocyte: 18.4 %
WBC: 11.7 Thousand/uL — ABNORMAL HIGH (ref 3.8–10.8)

## 2023-11-21 LAB — T4, FREE: Free T4: 1 ng/dL (ref 0.8–1.8)

## 2023-11-21 LAB — LITHIUM LEVEL: Lithium Lvl: 0.5 mmol/L — ABNORMAL LOW (ref 0.6–1.2)

## 2023-11-25 NOTE — Telephone Encounter (Signed)
 Copied from CRM [id number]. Topic: Clinical - Lab/Test Results >> Nov 25, 2023  9:07 AM Macario HERO wrote:  Reason for CRM: Patient is requesting a call for lab results.

## 2024-02-03 ENCOUNTER — Ambulatory Visit: Admitting: Family Medicine
